# Patient Record
Sex: Female | Born: 1995
Health system: Southern US, Community
[De-identification: ages and names within clinical notes are randomized; demographics above are authoritative.]

## PROBLEM LIST (undated history)

## (undated) DIAGNOSIS — F32A Depression, unspecified: Secondary | ICD-10-CM

## (undated) DIAGNOSIS — F329 Major depressive disorder, single episode, unspecified: Secondary | ICD-10-CM

## (undated) DIAGNOSIS — F419 Anxiety disorder, unspecified: Secondary | ICD-10-CM

## (undated) DIAGNOSIS — G43909 Migraine, unspecified, not intractable, without status migrainosus: Secondary | ICD-10-CM

## (undated) HISTORY — DX: Anxiety disorder, unspecified: F41.9

## (undated) HISTORY — DX: Major depressive disorder, single episode, unspecified: F32.9

## (undated) HISTORY — DX: Migraine, unspecified, not intractable, without status migrainosus: G43.909

## (undated) HISTORY — PX: NO PAST SURGERIES: SHX2092

## (undated) HISTORY — DX: Depression, unspecified: F32.A

---

## 2018-01-27 DIAGNOSIS — F410 Panic disorder [episodic paroxysmal anxiety] without agoraphobia: Secondary | ICD-10-CM | POA: Diagnosis not present

## 2018-01-27 DIAGNOSIS — F332 Major depressive disorder, recurrent severe without psychotic features: Secondary | ICD-10-CM | POA: Diagnosis not present

## 2018-01-27 DIAGNOSIS — F411 Generalized anxiety disorder: Secondary | ICD-10-CM | POA: Diagnosis not present

## 2018-02-24 DIAGNOSIS — F332 Major depressive disorder, recurrent severe without psychotic features: Secondary | ICD-10-CM | POA: Diagnosis not present

## 2018-02-24 DIAGNOSIS — F411 Generalized anxiety disorder: Secondary | ICD-10-CM | POA: Diagnosis not present

## 2018-02-24 DIAGNOSIS — F410 Panic disorder [episodic paroxysmal anxiety] without agoraphobia: Secondary | ICD-10-CM | POA: Diagnosis not present

## 2018-03-24 DIAGNOSIS — F332 Major depressive disorder, recurrent severe without psychotic features: Secondary | ICD-10-CM | POA: Diagnosis not present

## 2018-03-24 DIAGNOSIS — F410 Panic disorder [episodic paroxysmal anxiety] without agoraphobia: Secondary | ICD-10-CM | POA: Diagnosis not present

## 2018-03-24 DIAGNOSIS — F411 Generalized anxiety disorder: Secondary | ICD-10-CM | POA: Diagnosis not present

## 2018-04-12 DIAGNOSIS — N390 Urinary tract infection, site not specified: Secondary | ICD-10-CM | POA: Diagnosis not present

## 2018-04-21 DIAGNOSIS — Z113 Encounter for screening for infections with a predominantly sexual mode of transmission: Secondary | ICD-10-CM | POA: Diagnosis not present

## 2018-04-21 DIAGNOSIS — R300 Dysuria: Secondary | ICD-10-CM | POA: Diagnosis not present

## 2018-04-21 DIAGNOSIS — K6289 Other specified diseases of anus and rectum: Secondary | ICD-10-CM | POA: Diagnosis not present

## 2018-04-21 DIAGNOSIS — R102 Pelvic and perineal pain: Secondary | ICD-10-CM | POA: Diagnosis not present

## 2018-04-21 DIAGNOSIS — B373 Candidiasis of vulva and vagina: Secondary | ICD-10-CM | POA: Diagnosis not present

## 2018-05-12 DIAGNOSIS — R3982 Chronic bladder pain: Secondary | ICD-10-CM | POA: Diagnosis not present

## 2018-05-12 DIAGNOSIS — Z68.41 Body mass index (BMI) pediatric, 5th percentile to less than 85th percentile for age: Secondary | ICD-10-CM | POA: Diagnosis not present

## 2018-05-12 DIAGNOSIS — R102 Pelvic and perineal pain: Secondary | ICD-10-CM | POA: Diagnosis not present

## 2018-05-12 DIAGNOSIS — R895 Abnormal microbiological findings in specimens from other organs, systems and tissues: Secondary | ICD-10-CM | POA: Diagnosis not present

## 2018-06-15 DIAGNOSIS — F410 Panic disorder [episodic paroxysmal anxiety] without agoraphobia: Secondary | ICD-10-CM | POA: Diagnosis not present

## 2018-06-15 DIAGNOSIS — F332 Major depressive disorder, recurrent severe without psychotic features: Secondary | ICD-10-CM | POA: Diagnosis not present

## 2018-06-15 DIAGNOSIS — F411 Generalized anxiety disorder: Secondary | ICD-10-CM | POA: Diagnosis not present

## 2018-06-18 DIAGNOSIS — F4001 Agoraphobia with panic disorder: Secondary | ICD-10-CM | POA: Diagnosis not present

## 2018-06-18 DIAGNOSIS — R45851 Suicidal ideations: Secondary | ICD-10-CM | POA: Diagnosis not present

## 2018-06-18 DIAGNOSIS — Z79899 Other long term (current) drug therapy: Secondary | ICD-10-CM | POA: Diagnosis not present

## 2018-06-18 DIAGNOSIS — X788XXA Intentional self-harm by other sharp object, initial encounter: Secondary | ICD-10-CM | POA: Diagnosis not present

## 2018-06-18 DIAGNOSIS — F331 Major depressive disorder, recurrent, moderate: Secondary | ICD-10-CM | POA: Diagnosis not present

## 2018-06-18 DIAGNOSIS — Z23 Encounter for immunization: Secondary | ICD-10-CM | POA: Diagnosis not present

## 2018-06-18 DIAGNOSIS — T1491XA Suicide attempt, initial encounter: Secondary | ICD-10-CM | POA: Diagnosis not present

## 2018-06-18 DIAGNOSIS — S61512A Laceration without foreign body of left wrist, initial encounter: Secondary | ICD-10-CM | POA: Diagnosis not present

## 2018-06-19 DIAGNOSIS — F4001 Agoraphobia with panic disorder: Secondary | ICD-10-CM | POA: Insufficient documentation

## 2018-06-19 DIAGNOSIS — T1491XA Suicide attempt, initial encounter: Secondary | ICD-10-CM | POA: Diagnosis not present

## 2018-06-19 DIAGNOSIS — F331 Major depressive disorder, recurrent, moderate: Secondary | ICD-10-CM | POA: Diagnosis not present

## 2018-07-07 DIAGNOSIS — J029 Acute pharyngitis, unspecified: Secondary | ICD-10-CM | POA: Diagnosis not present

## 2018-07-14 DIAGNOSIS — N341 Nonspecific urethritis: Secondary | ICD-10-CM | POA: Diagnosis not present

## 2018-07-14 DIAGNOSIS — Z6824 Body mass index (BMI) 24.0-24.9, adult: Secondary | ICD-10-CM | POA: Diagnosis not present

## 2018-09-14 DIAGNOSIS — F411 Generalized anxiety disorder: Secondary | ICD-10-CM | POA: Diagnosis not present

## 2018-09-14 DIAGNOSIS — F410 Panic disorder [episodic paroxysmal anxiety] without agoraphobia: Secondary | ICD-10-CM | POA: Diagnosis not present

## 2018-09-14 DIAGNOSIS — F332 Major depressive disorder, recurrent severe without psychotic features: Secondary | ICD-10-CM | POA: Diagnosis not present

## 2019-01-29 ENCOUNTER — Ambulatory Visit (INDEPENDENT_AMBULATORY_CARE_PROVIDER_SITE_OTHER): Payer: BLUE CROSS/BLUE SHIELD | Admitting: Family Medicine

## 2019-01-29 ENCOUNTER — Encounter: Payer: Self-pay | Admitting: Family Medicine

## 2019-01-29 ENCOUNTER — Other Ambulatory Visit: Payer: Self-pay

## 2019-01-29 VITALS — Ht 65.0 in | Wt 150.0 lb

## 2019-01-29 DIAGNOSIS — R198 Other specified symptoms and signs involving the digestive system and abdomen: Secondary | ICD-10-CM

## 2019-01-29 DIAGNOSIS — Z1159 Encounter for screening for other viral diseases: Secondary | ICD-10-CM | POA: Diagnosis not present

## 2019-01-29 DIAGNOSIS — Z114 Encounter for screening for human immunodeficiency virus [HIV]: Secondary | ICD-10-CM | POA: Diagnosis not present

## 2019-01-29 DIAGNOSIS — R1030 Lower abdominal pain, unspecified: Secondary | ICD-10-CM | POA: Insufficient documentation

## 2019-01-29 DIAGNOSIS — F332 Major depressive disorder, recurrent severe without psychotic features: Secondary | ICD-10-CM | POA: Insufficient documentation

## 2019-01-29 DIAGNOSIS — R197 Diarrhea, unspecified: Secondary | ICD-10-CM | POA: Insufficient documentation

## 2019-01-29 DIAGNOSIS — Z113 Encounter for screening for infections with a predominantly sexual mode of transmission: Secondary | ICD-10-CM

## 2019-01-29 DIAGNOSIS — F411 Generalized anxiety disorder: Secondary | ICD-10-CM | POA: Insufficient documentation

## 2019-01-29 MED ORDER — ESCITALOPRAM OXALATE 10 MG PO TABS: 10.0000 mg | ORAL_TABLET | Freq: Every day | ORAL | 0 refills | Status: DC

## 2019-01-29 MED ORDER — DICYCLOMINE HCL 10 MG PO CAPS: 10.0000 mg | ORAL_CAPSULE | Freq: Three times a day (TID) | ORAL | 0 refills | Status: DC | PRN

## 2019-01-29 NOTE — Progress Notes (Signed)
Name: Olivia Sullivan   MRN: [id number]    DOB: 11/29/1995   Date:01/29/2019       Progress Note  Subjective  Chief Complaint  Chief Complaint  Patient presents with  . Establish Care  . Abdominal Pain    on and off for 3 weeks  . Depression    refill medication, referral  . Anxiety    I connected with  Bethena Roys  on 01/29/19 at  8:40 AM EDT by a video enabled telemedicine application and verified that I am speaking with the correct person using two identifiers.  I discussed the limitations of evaluation and management by telemedicine and the availability of in person appointments. The patient expressed understanding and agreed to proceed. Staff also discussed with the patient that there may be a patient responsible charge related to this service. Patient Location: Home Provider Location: Home Additional Individuals present: None  HPI   Pt presents to establish care and for the following:  Social: Moved here 2 months ago with her boyfriend, family and friends live in [place].  She is working in [employer]   Abdominal Pain: She has family history of IBS (Sister and brother).  Has had constipation or diarrhea alternating for 3 weeks.  She has seen very dark diarrhea and has a "blood smell", nausea, bloating and flatulence.  Pain is occurring after she eats and is relieved after going to the bathroom, then returns a few hours later.  She has not changed her diet recently.  Gall bladder is intact. States pain is typically in the lower abdomen and central.  Also endorses nausea and occassional vomiting (2-3 times in a week - bile). Triggers are fried/oily foods and dairy.   Depression/Anxiety: She has had anxiety since she was a child, depression for about 5 years.  Self harms with razor on her upper thigh (last self-harm was 1 month ago), has never needed emergency care for injuries.  She has had multiple suicide attempts with overdose - most recently was 6-8 months ago and required  psychiatric hospitalization - her only psychiatric hospitalization.  She has since been taking  Lexapro, ran out about 2 months.  She has +PHQ9 for SI, however when discussed in detail, she denies any current suicidal ideation or thoughts of self harm.  She has her boyfriend and sister as support persons, discussed mobile crisis numbers as well.  There are no active problems to display for this patient.   Past Surgical History:  Procedure Laterality Date  . NO PAST SURGERIES      Family History  Problem Relation Age of Onset  . Diabetes Father   . Depression Father   . Anxiety disorder Father   . Depression Sister   . Anxiety disorder Sister   . Irritable bowel syndrome Sister   . Depression Brother   . Anxiety disorder Brother   . Irritable bowel syndrome Brother   . Diabetes Maternal Grandmother     Social History   Socioeconomic History  . Marital status: Single    Spouse name: Not on file  . Number of children: 0  . Years of education: Not on file  . Highest education level: Not on file  Occupational History  . Occupation: Psychologist, forensic  Social Needs  . Financial resource strain: Not hard at all  . Food insecurity:    Worry: Never true    Inability: Never true  . Transportation needs:    Medical: No    Non-medical: No  Tobacco Use  . Smoking status: Never Smoker  . Smokeless tobacco: Never Used  Substance and Sexual Activity  . Alcohol use: Never    Frequency: Never  . Drug use: Never  . Sexual activity: Yes    Partners: Male    Birth control/protection: Pill  Lifestyle  . Physical activity:    Days per week: 0 days    Minutes per session: 0 min  . Stress: Not at all  Relationships  . Social connections:    Talks on phone: More than three times a week    Gets together: More than three times a week    Attends religious service: Never    Active member of club or organization: No    Attends meetings of clubs or organizations: Never    Relationship  status: Never married  . Intimate partner violence:    Fear of current or ex partner: No    Emotionally abused: No    Physically abused: No    Forced sexual activity: No  Other Topics Concern  . Not on file  Social History Narrative   Lives with Boyfriend in [place]; family and friends live in [place].  Working as a Psychologist, forensic in [employer]     Current Outpatient Medications:  .  norgestimate-ethinyl estradiol (ORTHO-CYCLEN) 0.25-35 MG-MCG tablet, Take 1 tablet by mouth daily., Disp: , Rfl:  .  dicyclomine (BENTYL) 10 MG capsule, Take 1 capsule (10 mg total) by mouth 3 (three) times daily as needed for spasms., Disp: 30 capsule, Rfl: 0 .  escitalopram (LEXAPRO) 10 MG tablet, Take 1 tablet (10 mg total) by mouth daily., Disp: 90 tablet, Rfl: 0  Allergies not on file  I personally reviewed active problem list, medication list, allergies, family history, social history with the patient/caregiver today.   ROS Constitutional: Negative for fever or weight change.  Respiratory: Negative for cough and shortness of breath.   Cardiovascular: Negative for chest pain or palpitations.  Gastrointestinal: Positive for abdominal pain, and bowel changes.  Musculoskeletal: Negative for gait problem or joint swelling.  Skin: Negative for rash.  Neurological: Negative for dizziness or headache.  No other specific complaints in a complete review of systems (except as listed in HPI above).   Objective  Virtual encounter, vitals not obtained.  Body mass index is 24.96 kg/m.  Physical Exam  Constitutional: Patient appears well-developed and well-nourished. No distress.  HENT: Head: Normocephalic and atraumatic.  Neck: Normal range of motion. Pulmonary/Chest: Effort normal. No respiratory distress. Speaking in complete sentences Neurological: Pt is alert and oriented to person, place, and time. Coordination, speech and gait are normal.  Psychiatric: Patient has a normal mood and affect.  behavior is normal. Judgment and thought content normal. GI: Patient palpates her abdomen which appears soft, and she denies tenderness, though there is mild discomfort in the central lower abdomen.  No results found for this or any previous visit (from the past 72 hour(s)).  PHQ2/9: Depression screen [facility] 2/9 01/29/2019 01/29/2019  Decreased Interest 2 0  Down, Depressed, Hopeless 2 0  PHQ - 2 Score 4 0  Altered sleeping 3 0  Tired, decreased energy 2 0  Change in appetite 2 0  Feeling bad or failure about yourself  3 0  Trouble concentrating 2 0  Moving slowly or fidgety/restless 2 0  Suicidal thoughts 2 0  PHQ-9 Score 20 0  Difficult doing work/chores Very difficult Not difficult at all   PHQ-2/9 Result is positive.    Fall  Risk: Fall Risk  01/29/2019  Falls in the past year? 0  Number falls in past yr: 0  Injury with Fall? 0  Follow up Falls evaluation completed    Assessment & Plan  1. Lower abdominal pain - dicyclomine (BENTYL) 10 MG capsule; Take 1 capsule (10 mg total) by mouth 3 (three) times daily as needed for spasms.  Dispense: 30 capsule; Refill: 0 - Ambulatory referral to Gastroenterology - COMPLETE METABOLIC PANEL WITH GFR - CBC w/Diff/Platelet - Hepatitis C antibody  2. Alternating constipation and diarrhea - dicyclomine (BENTYL) 10 MG capsule; Take 1 capsule (10 mg total) by mouth 3 (three) times daily as needed for spasms.  Dispense: 30 capsule; Refill: 0 - Ambulatory referral to Gastroenterology  3. Need for hepatitis C screening test - Hepatitis C antibody  4. Encounter for screening for HIV - HIV Antibody (routine testing w rflx)  5. Routine screening for STI (sexually transmitted infection) - HIV Antibody (routine testing w rflx) - RPR - Will do pap w/ CPE and will obtain GC/chlam at that time.  6. GAD (generalized anxiety disorder) - escitalopram (LEXAPRO) 10 MG tablet; Take 1 tablet (10 mg total) by mouth daily.  Dispense: 90 tablet; Refill:  0 - Ambulatory referral to Psychiatry  7. Severe episode of recurrent major depressive disorder, without psychotic features (HCC) - escitalopram (LEXAPRO) 10 MG tablet; Take 1 tablet (10 mg total) by mouth daily.  Dispense: 90 tablet; Refill: 0 - Ambulatory referral to Psychiatry   I discussed the assessment and treatment plan with the patient. The patient was provided an opportunity to ask questions and all were answered. The patient agreed with the plan and demonstrated an understanding of the instructions.  The patient was advised to call back or seek an in-person evaluation if the symptoms worsen or if the condition fails to improve as anticipated.  I provided 36 minutes of non-face-to-face time during this encounter.

## 2019-01-29 NOTE — Patient Instructions (Addendum)
Here are some resources to help you if you feel you are in a mental health crisis:  National Suicide Prevention Lifeline - Call 575-314-92901-917-054-6425  for help - Website with more resources: ARanked.fihttps://suicidepreventionlifeline.org/  [facility] - Call [phone] for help. - Mobile Crisis Program available 24 hours a day, 365 days a year. - Available for anyone of any age in [place].  [facility] - Address: [address], [place] - Telephone: [phone]  - Hours of Operation: Sunday - Saturday - 8:00 a.m. - 8:00 p.m. - Medicaid, Medicare (Government Issued Only), BCBS, and [employer] - Pay - Crisis Management, Outpatient Individual & Group Therapy, Psychiatrists on-site to provide medication management, In-Home Psychiatric Care, and Peer Support Care.  [facility] - Call [phone] for help. - Mobile Crisis Program available 24 hours a day, 365 days a year. - Available for anyone of any age in [place] & [place]   Low-FODMAP Eating Plan  FODMAPs (fermentable oligosaccharides, disaccharides, monosaccharides, and polyols) are sugars that are hard for some people to digest. A low-FODMAP eating plan may help some people who have bowel (intestinal) diseases to manage their symptoms. This meal plan can be complicated to follow. Work with a diet and nutrition specialist (dietitian) to make a low-FODMAP eating plan that is right for you. A dietitian can make sure that you get enough nutrition from this diet. What are tips for following this plan? Reading food labels  Check labels for hidden FODMAPs such as: ? High-fructose syrup. ? Honey. ? Agave. ? Natural fruit flavors. ? Onion or garlic powder.  Choose low-FODMAP foods that contain 3-4 grams of fiber per serving.  Check food labels for serving sizes. Eat only one serving at a time to make sure FODMAP levels stay low. Meal  planning  Follow a low-FODMAP eating plan for up to 6 weeks, or as told by your health care provider or dietitian.  To follow the eating plan: 1. Eliminate high-FODMAP foods from your diet completely. 2. Gradually reintroduce high-FODMAP foods into your diet one at a time. Most people should wait a few days after introducing one high-FODMAP food before they introduce the next high-FODMAP food. Your dietitian can recommend how quickly you may reintroduce foods. 3. Keep a daily record of what you eat and drink, and make note of any symptoms that you have after eating. 4. Review your daily record with a dietitian regularly. Your dietitian can help you identify which foods you can eat and which foods you should avoid. General tips  Drink enough fluid each day to keep your urine pale yellow.  Avoid processed foods. These often have added sugar and may be high in FODMAPs.  Avoid most dairy products, whole grains, and sweeteners.  Work with a dietitian to make sure you get enough fiber in your diet. Recommended foods Grains  Gluten-free grains, such as rice, oats, buckwheat, quinoa, corn, polenta, and millet. Gluten-free pasta, bread, or cereal. Rice noodles. Corn tortillas. Vegetables  Eggplant, zucchini, cucumber, peppers, green beans, Brussels sprouts, bean sprouts, lettuce, arugula, kale, Swiss chard, spinach, collard greens, bok choy, summer squash, potato, and tomato. Limited amounts of corn, carrot, and sweet potato. Green parts of scallions. Fruits  Bananas, oranges, lemons, limes, blueberries, raspberries, strawberries, grapes, cantaloupe, honeydew melon, kiwi, papaya, passion fruit, and pineapple. Limited amounts of dried cranberries, banana chips, and shredded coconut. Dairy  Lactose-free milk, yogurt, and kefir. Lactose-free cottage cheese and ice cream. Non-dairy milks,  such as almond, coconut, hemp, and rice milk. Yogurts made of non-dairy milks. Limited amounts of goat cheese,  brie, mozzarella, parmesan, swiss, and other hard cheeses. Meats and other protein foods  Unseasoned beef, pork, poultry, or fish. Eggs. Tomasa Blase. Tofu (firm) and tempeh. Limited amounts of nuts and seeds, such as almonds, walnuts, [place] nuts, pecans, peanuts, pumpkin seeds, chia seeds, and sunflower seeds. Fats and oils  Butter-free spreads. Vegetable oils, such as olive, canola, and sunflower oil. Seasoning and other foods  Artificial sweeteners with names that do not end in "ol" such as aspartame, saccharine, and stevia. Maple syrup, white table sugar, raw sugar, brown sugar, and molasses. Fresh basil, coriander, parsley, rosemary, and thyme. Beverages  Water and mineral water. Sugar-sweetened soft drinks. Small amounts of orange juice or cranberry juice. Black and green tea. Most dry wines. Coffee. This may not be a complete list of low-FODMAP foods. Talk with your dietitian for more information. Foods to avoid Grains  Wheat, including kamut, durum, and semolina. Barley and bulgur. Couscous. Wheat-based cereals. Wheat noodles, bread, crackers, and pastries. Vegetables  Chicory root, artichoke, asparagus, cabbage, snow peas, sugar snap peas, mushrooms, and cauliflower. Onions, garlic, leeks, and the white part of scallions. Fruits  Fresh, dried, and juiced forms of apple, pear, watermelon, peach, plum, cherries, apricots, blackberries, boysenberries, figs, nectarines, and mango. Avocado. Dairy  Milk, yogurt, ice cream, and soft cheese. Cream and sour cream. Milk-based sauces. Custard. Meats and other protein foods  Fried or fatty meat. Sausage. Cashews and pistachios. Soybeans, baked beans, black beans, chickpeas, kidney beans, fava beans, navy beans, lentils, and split peas. Seasoning and other foods  Any sugar-free gum or candy. Foods that contain artificial sweeteners such as sorbitol, mannitol, isomalt, or xylitol. Foods that contain honey, high-fructose corn syrup, or agave.  Bouillon, vegetable stock, beef stock, and chicken stock. Garlic and onion powder. Condiments made with onion, such as hummus, chutney, pickles, relish, salad dressing, and salsa. Tomato paste. Beverages  Chicory-based drinks. Coffee substitutes. Chamomile tea. Fennel tea. Sweet or fortified wines such as port or sherry. Diet soft drinks made with isomalt, mannitol, maltitol, sorbitol, or xylitol. Apple, pear, and mango juice. Juices with high-fructose corn syrup. This may not be a complete list of high-FODMAP foods. Talk with your dietitian to discuss what dietary choices are best for you.  Summary  A low-FODMAP eating plan is a short-term diet that eliminates FODMAPs from your diet to help ease symptoms of certain bowel diseases.  The eating plan usually lasts up to 6 weeks. After that, high-FODMAP foods are restarted gradually, one at a time, so you can find out which may be causing symptoms.  A low-FODMAP eating plan can be complicated. It is best to work with a dietitian who has experience with this type of plan. This information is not intended to replace advice given to you by your health care provider. Make sure you discuss any questions you have with your health care provider. Document Released: 05/20/2017 Document Revised: 05/20/2017 Document Reviewed: 05/20/2017 Elsevier Interactive Patient Education  [employer].

## 2019-02-02 ENCOUNTER — Encounter: Payer: Self-pay | Admitting: Gastroenterology

## 2019-02-02 ENCOUNTER — Ambulatory Visit (INDEPENDENT_AMBULATORY_CARE_PROVIDER_SITE_OTHER): Payer: BLUE CROSS/BLUE SHIELD | Admitting: Gastroenterology

## 2019-02-02 DIAGNOSIS — Z1159 Encounter for screening for other viral diseases: Secondary | ICD-10-CM | POA: Diagnosis not present

## 2019-02-02 DIAGNOSIS — R197 Diarrhea, unspecified: Secondary | ICD-10-CM

## 2019-02-02 DIAGNOSIS — R1030 Lower abdominal pain, unspecified: Secondary | ICD-10-CM | POA: Diagnosis not present

## 2019-02-02 DIAGNOSIS — Z114 Encounter for screening for human immunodeficiency virus [HIV]: Secondary | ICD-10-CM | POA: Diagnosis not present

## 2019-02-02 DIAGNOSIS — Z113 Encounter for screening for infections with a predominantly sexual mode of transmission: Secondary | ICD-10-CM | POA: Diagnosis not present

## 2019-02-02 DIAGNOSIS — R112 Nausea with vomiting, unspecified: Secondary | ICD-10-CM

## 2019-02-02 NOTE — Progress Notes (Addendum)
Olivia Sullivan [address]  [address]  [place], [place] [address]  Main: [phone]  Fax: [phone]   Gastroenterology Consultation  Referring Provider:     Doren Custard, FNP Primary Care Physician:  Doren Custard, FNP Reason for Consultation:     Diarrhea        HPI:   Virtual Visit via Video Note  I connected with patient on 02/02/19 at 11:00 AM EDT by video ([email or web address]) and verified that I am speaking with the correct person using two identifiers.   I discussed the limitations, risks, security and privacy concerns of performing an evaluation and management service by video and the availability of in person appointments. I also discussed with the patient that there may be a patient responsible charge related to this service. The patient expressed understanding and agreed to proceed.  Location of the patient: Home Location of provider: Home Participating persons: Patient and provider only (Nursing staff checked in patient via phone but were not physically involved in the video interaction - see their notes)   History of Present Illness: Chief Complaint  Patient presents with  . New Patient (Initial Visit)    Lower abdominal pain    Olivia Sullivan is a 23 y.o. y/o female referred for consultation & management  by Dr. Annye Asa, Gerome Apley, FNP.  Patient reports 3-week history of bilateral lower abdominal cramping and diarrhea.  Reports the cramping to be a dull pain, nonradiating, 3/10.  Patient has also had some nausea and vomiting during this time.  No hematemesis, no hematochezia or melena.  Denies any sick contacts or recent travel.  States she is not pregnant as she uses birth control and has not been having sexual intercourse.  No weight loss.  No family history of colon cancer.  Reports brother has Crohn's disease.  No prior EGD or colonoscopy.  Denies any recent stressors or medication changes.  Bentyl was prescribed by primary care physician but patient has not  started this.  PCP notes report history of depression, and anxiety.  Past Medical History:  Diagnosis Date  . Anxiety   . Depression   . Migraine     Past Surgical History:  Procedure Laterality Date  . NO PAST SURGERIES      Prior to Admission medications   Medication Sig Start Date End Date Taking? Authorizing Provider  dicyclomine (BENTYL) 10 MG capsule Take 1 capsule (10 mg total) by mouth 3 (three) times daily as needed for spasms. 01/29/19  Yes Doren Custard, FNP  escitalopram (LEXAPRO) 10 MG tablet Take 1 tablet (10 mg total) by mouth daily. 01/29/19  Yes Doren Custard, FNP  norgestimate-ethinyl estradiol (ORTHO-CYCLEN) 0.25-35 MG-MCG tablet Take 1 tablet by mouth daily.   Yes [provider]    Family History  Problem Relation Age of Onset  . Diabetes Father   . Depression Father   . Anxiety disorder Father   . Depression Sister   . Anxiety disorder Sister   . Irritable bowel syndrome Sister   . Depression Brother   . Anxiety disorder Brother   . Irritable bowel syndrome Brother   . Diabetes Maternal Grandmother      Social History   Tobacco Use  . Smoking status: Never Smoker  . Smokeless tobacco: Never Used  Substance Use Topics  . Alcohol use: Never    Frequency: Never  . Drug use: Never    Allergies as of 02/02/2019  . (No Known Allergies)  Review of Systems:    All systems reviewed and negative except where noted in HPI.   Observations/Objective:  Labs: CBC No results found for: WBC, RBC, HGB, HCT, PLT, MCV, MCH, MCHC, RDW, LYMPHSABS, MONOABS, EOSABS, BASOSABS CMP  No results found for: NA, K, CL, CO2, GLUCOSE, BUN, CREATININE, CALCIUM, PROT, ALBUMIN, AST, ALT, ALKPHOS, BILITOT, GFRNONAA, GFRAA  Imaging Studies: No results found.  Assessment and Plan:   Olivia Sullivan is a 23 y.o. y/o female has been referred for abdominal pain, diarrhea  Assessment and Plan: Patient may be related to gastroenteritis given nausea vomiting  and diarrhea started at the same time  Patient had blood work drawn today and results are pending.  Will await pending labs.  We will also check inflammatory markers, fecal calprotectin, and GI panel to evaluate for inflammation or infectious causes. Will also check urine pregnancy test  Patient can start Bentyl to see if it helps with her symptoms.  If above work-up is abnormal, patient may need colonoscopy.  However, if it is normal, symptoms may be related to self-limited gastroenteritis.  Follow Up Instructions: Clinic follow-up in 3 to 4 weeks or earlier if needed  I discussed the assessment and treatment plan with the patient. The patient was provided an opportunity to ask questions and all were answered. The patient agreed with the plan and demonstrated an understanding of the instructions.   The patient was advised to call back or seek an in-person evaluation if the symptoms worsen or if the condition fails to improve as anticipated.  I provided 20 minutes of face-to-face time via video software during this encounter.   Pasty SpillersVarnita B Crispin Vogel, MD  Speech recognition software was used to dictate the above note.

## 2019-02-02 NOTE — Addendum Note (Signed)
Addended by: Jackquline Denmark on: 02/02/2019 11:51 AM   Modules accepted: Orders

## 2019-02-02 NOTE — Addendum Note (Signed)
Addended by: Melodie Bouillon on: 02/02/2019 11:21 AM   Modules accepted: Orders

## 2019-02-03 ENCOUNTER — Telehealth: Payer: Self-pay

## 2019-02-03 LAB — COMPLETE METABOLIC PANEL WITH GFR
AG Ratio: 1.5 (calc) (ref 1.0–2.5)
ALT: 14 U/L (ref 6–29)
AST: 17 U/L (ref 10–30)
Albumin: 4.1 g/dL (ref 3.6–5.1)
Alkaline phosphatase (APISO): 76 U/L (ref 31–125)
BUN: 14 mg/dL (ref 7–25)
CO2: 25 mmol/L (ref 20–32)
Calcium: 9.2 mg/dL (ref 8.6–10.2)
Chloride: 105 mmol/L (ref 98–110)
Creat: 0.73 mg/dL (ref 0.50–1.10)
GFR, Est African American: 136 mL/min/{1.73_m2} (ref >=60)
GFR, Est Non African American: 118 mL/min/{1.73_m2} (ref >=60)
Globulin: 2.8 g/dL (calc) (ref 1.9–3.7)
Glucose, Bld: 100 mg/dL — ABNORMAL HIGH (ref 65–99)
Potassium: 3.9 mmol/L (ref 3.5–5.3)
Sodium: 139 mmol/L (ref 135–146)
Total Bilirubin: 0.3 mg/dL (ref 0.2–1.2)
Total Protein: 6.9 g/dL (ref 6.1–8.1)

## 2019-02-03 LAB — CBC WITH DIFFERENTIAL/PLATELET
Absolute Monocytes: 601 cells/uL (ref 200–950)
Basophils Absolute: 64 cells/uL (ref 0–200)
Basophils Relative: 0.7 %
Eosinophils Absolute: 109 cells/uL (ref 15–500)
Eosinophils Relative: 1.2 %
HCT: 40.1 % (ref 35.0–45.0)
Hemoglobin: 13.1 g/dL (ref 11.7–15.5)
Lymphs Abs: 2958 cells/uL (ref 850–3900)
MCH: 27.6 pg (ref 27.0–33.0)
MCHC: 32.7 g/dL (ref 32.0–36.0)
MCV: 84.4 fL (ref 80.0–100.0)
MPV: 10.1 fL (ref 7.5–12.5)
Monocytes Relative: 6.6 %
Neutro Abs: 5369 cells/uL (ref 1500–7800)
Neutrophils Relative %: 59 %
Platelets: 422 10*3/uL — ABNORMAL HIGH (ref 140–400)
RBC: 4.75 10*6/uL (ref 3.80–5.10)
RDW: 12.3 % (ref 11.0–15.0)
Total Lymphocyte: 32.5 %
WBC: 9.1 10*3/uL (ref 3.8–10.8)

## 2019-02-03 LAB — HIV ANTIBODY (ROUTINE TESTING W REFLEX): HIV 1&2 Ab, 4th Generation: NONREACTIVE

## 2019-02-03 LAB — RPR: RPR Ser Ql: NONREACTIVE

## 2019-02-03 LAB — HEPATITIS C ANTIBODY
Hepatitis C Ab: NONREACTIVE
SIGNAL TO CUT-OFF: 0 (ref <1.00)

## 2019-02-03 NOTE — Telephone Encounter (Signed)
Pt notifed of labs needed and instructions for H pylori testing. Also told of the [facility] drawing station on [address].

## 2019-02-03 NOTE — Telephone Encounter (Signed)
-----   Message from [name], MD sent at 02/03/2019  9:39 AM EDT ----- Please ask pt to go ahead and get her labs done that I ordered. I have reviewed the CBC and CMP her PCP ordered.

## 2019-02-09 DIAGNOSIS — R197 Diarrhea, unspecified: Secondary | ICD-10-CM | POA: Diagnosis not present

## 2019-02-09 DIAGNOSIS — R112 Nausea with vomiting, unspecified: Secondary | ICD-10-CM | POA: Diagnosis not present

## 2019-02-10 LAB — H. PYLORI BREATH TEST: H pylori Breath Test: NEGATIVE

## 2019-02-10 LAB — SEDIMENTATION RATE: Sed Rate: 12 mm/hr (ref 0–32)

## 2019-02-10 LAB — C-REACTIVE PROTEIN: CRP: 6 mg/L (ref 0–10)

## 2019-02-10 LAB — PREGNANCY, URINE: Preg Test, Ur: NEGATIVE

## 2019-02-14 DIAGNOSIS — R197 Diarrhea, unspecified: Secondary | ICD-10-CM | POA: Diagnosis not present

## 2019-02-15 ENCOUNTER — Other Ambulatory Visit: Payer: Self-pay

## 2019-02-15 ENCOUNTER — Telehealth: Payer: Self-pay | Admitting: Gastroenterology

## 2019-02-15 DIAGNOSIS — R197 Diarrhea, unspecified: Secondary | ICD-10-CM

## 2019-02-15 NOTE — Telephone Encounter (Signed)
Orders for stool samples reordered and left pt message this was done and she go and get this done.

## 2019-02-15 NOTE — Telephone Encounter (Signed)
Pt is calling she states she recently went to [facility] to have Labs done she states we need to re order the stool sample

## 2019-02-16 ENCOUNTER — Encounter: Payer: Self-pay | Admitting: Family Medicine

## 2019-02-16 ENCOUNTER — Telehealth: Payer: Self-pay | Admitting: Family Medicine

## 2019-02-16 ENCOUNTER — Encounter: Payer: Self-pay | Admitting: Emergency Medicine

## 2019-02-16 ENCOUNTER — Ambulatory Visit (INDEPENDENT_AMBULATORY_CARE_PROVIDER_SITE_OTHER): Payer: BLUE CROSS/BLUE SHIELD | Admitting: Family Medicine

## 2019-02-16 ENCOUNTER — Other Ambulatory Visit: Payer: Self-pay

## 2019-02-16 DIAGNOSIS — R197 Diarrhea, unspecified: Secondary | ICD-10-CM | POA: Diagnosis not present

## 2019-02-16 DIAGNOSIS — F411 Generalized anxiety disorder: Secondary | ICD-10-CM

## 2019-02-16 DIAGNOSIS — F332 Major depressive disorder, recurrent severe without psychotic features: Secondary | ICD-10-CM | POA: Diagnosis not present

## 2019-02-16 DIAGNOSIS — R112 Nausea with vomiting, unspecified: Secondary | ICD-10-CM | POA: Diagnosis not present

## 2019-02-16 MED ORDER — ESCITALOPRAM OXALATE 10 MG PO TABS: 20.0000 mg | ORAL_TABLET | Freq: Every day | ORAL | 0 refills | Status: DC

## 2019-02-16 MED ORDER — ONDANSETRON HCL 4 MG PO TABS: 4.0000 mg | ORAL_TABLET | Freq: Three times a day (TID) | ORAL | 0 refills | Status: DC | PRN

## 2019-02-16 NOTE — Patient Instructions (Addendum)
Increase your Lexapro: Take 1.5 Tablets once daily for 7 days, then increase to 2 tablets once daily (20mg )  Here are some resources to help you if you feel you are in a mental health crisis:  National Suicide Prevention Lifeline - Call 574-749-0273  for help - Website with more resources: ARanked.fi  Transport planner Crisis Program - Call [phone] for help. - Mobile Crisis Program available 24 hours a day, 365 days a year. - Available for anyone of any age in [place].  [facility] - Address: [address], [place] - Telephone: [phone]  - Hours of Operation: Sunday - Saturday - 8:00 a.m. - 8:00 p.m. - Medicaid, Medicare (Government Issued Only), BCBS, and [employer] - Pay - Crisis Management, Outpatient Individual & Group Therapy, Psychiatrists on-site to provide medication management, In-Home Psychiatric Care, and Peer Support Care.  [facility] - Call [phone] for help. - Mobile Crisis Program available 24 hours a day, 365 days a year. - Available for anyone of any age in [place] [facility]

## 2019-02-16 NOTE — Telephone Encounter (Signed)
Patient notified, will call that office for appointment. Office note for work given

## 2019-02-16 NOTE — Progress Notes (Signed)
Name: Olivia Sullivan   MRN: [id number]    DOB: [birth year]   Date:02/16/2019       Progress Note  Subjective  Chief Complaint  Chief Complaint  Patient presents with  . Emesis    seen streaks of blood in vomit. patient did not want to go to ER  . Abdominal Pain  . Rectal Bleeding    streaks.     I connected with  Olivia Sullivan  on 02/16/19 at 10:00 AM EDT by a video enabled telemedicine application and verified that I am speaking with the correct person using two identifiers.  I discussed the limitations of evaluation and management by telemedicine and the availability of in person appointments. The patient expressed understanding and agreed to proceed. Staff also discussed with the patient that there may be a patient responsible charge related to this service. Patient Location: Home Provider Location: Home Additional Individuals present: None  HPI  Pt presents with the following concerns:  Dairrhea and Vomiting: Has noticed streaks of BRB in stool intermittently.  She has had 2-3 episodes of diarrhea in the last 24 hours.  This morning she vomiting at work and noticed some small dots of blood, no streaks, no coffee ground emesis.  She had 2 episodes of vomiting this morning.  She is seeing Dr. Maximino Greenlandahiliani for Diarrhea, Nausea, and Vomiting.  She did drop off stool sample yesterday; also had labs drawn on 02/09/2019.  She does not feel that her symptoms have worsened over the last 5-6 days since having her labs drawn.  She does endorse some mild lightheadedness, having fatigue.  She has not been able to check her BP at home.   Depression & Anxiety: She was started on Lexapro 10mg  on 01/29/2019 (about 3.5 weeks) and she does feel that it has helped some, though her PHQ-9 score is actually 4 points worse than last visit. We will increase Lexapro to 20mg  to help reduce symptoms.  She has an appointment with Dr. Maryruth BunKapur on 04/30/2019.  She denies SI/HI.    [facility]  PHQ-9 Total Score  24     Patient Active Problem List   Diagnosis Date Noted  . Lower abdominal pain 01/29/2019  . Alternating constipation and diarrhea 01/29/2019  . GAD (generalized anxiety disorder) 01/29/2019  . Severe episode of recurrent major depressive disorder, without psychotic features (HCC) 01/29/2019  . Panic disorder with agoraphobia 06/19/2018    Past Surgical History:  Procedure Laterality Date  . NO PAST SURGERIES      Family History  Problem Relation Age of Onset  . Diabetes Father   . Depression Father   . Anxiety disorder Father   . Depression Sister   . Anxiety disorder Sister   . Irritable bowel syndrome Sister   . Depression Brother   . Anxiety disorder Brother   . Irritable bowel syndrome Brother   . Diabetes Maternal Grandmother     Social History   Socioeconomic History  . Marital status: Single    Spouse name: Not on file  . Number of children: 0  . Years of education: Not on file  . Highest education level: Not on file  Occupational History  . Occupation: Psychologist, forensickennel tech  Social Needs  . Financial resource strain: Not hard at all  . Food insecurity:    Worry: Never true    Inability: Never true  . Transportation needs:    Medical: No    Non-medical:  No  Tobacco Use  . Smoking status: Never Smoker  . Smokeless tobacco: Never Used  Substance and Sexual Activity  . Alcohol use: Never    Frequency: Never  . Drug use: Never  . Sexual activity: Yes    Partners: Male    Birth control/protection: Pill  Lifestyle  . Physical activity:    Days per week: 0 days    Minutes per session: 0 min  . Stress: Not at all  Relationships  . Social connections:    Talks on phone: More than three times a week    Gets together: More than three times a week    Attends religious service: Never    Active member of club or organization: No    Attends meetings of clubs or organizations: Never    Relationship status: Never  married  . Intimate partner violence:    Fear of current or ex partner: No    Emotionally abused: No    Physically abused: No    Forced sexual activity: No  Other Topics Concern  . Not on file  Social History Narrative   Lives with Boyfriend in [place]; family and friends live in [place].  Working as a Psychologist, forensic in [employer]     Current Outpatient Medications:  .  dicyclomine (BENTYL) 10 MG capsule, Take 1 capsule (10 mg total) by mouth 3 (three) times daily as needed for spasms., Disp: 30 capsule, Rfl: 0 .  escitalopram (LEXAPRO) 10 MG tablet, Take 1 tablet (10 mg total) by mouth daily., Disp: 90 tablet, Rfl: 0 .  norgestimate-ethinyl estradiol (ORTHO-CYCLEN) 0.25-35 MG-MCG tablet, Take 1 tablet by mouth daily., Disp: , Rfl:   No Known Allergies  I personally reviewed active problem list, medication list, allergies, notes from last encounter, lab results with the patient/caregiver today.   ROS Ten systems reviewed and is negative except as mentioned in HPI  Objective  Virtual encounter, vitals not obtained.  There is no height or weight on file to calculate BMI.  Physical Exam Constitutional: Patient appears well-developed and well-nourished. No distress.  HENT: Head: Normocephalic and atraumatic.  Neck: Normal range of motion. Pulmonary/Chest: Effort normal. No respiratory distress. Speaking in complete sentences Neurological: Pt is alert and oriented to person, place, and time. Coordination, speech and gait are normal.  Psychiatric: Patient has a normal mood and affect. behavior is normal. Judgment and thought content normal. Abdominal: Endorses mild tenderness upon self-palpation of the abdomen in the central lower abdomen.    No results found for this or any previous visit (from the past 72 hour(s)).  PHQ2/9: Depression screen [facility] 2/9 02/16/2019 01/29/2019 01/29/2019  Decreased Interest 2 2 0  Down, Depressed, Hopeless 2 2 0  PHQ - 2 Score 4 4 0  Altered  sleeping 3 3 0  Tired, decreased energy 3 2 0  Change in appetite 3 2 0  Feeling bad or failure about yourself  3 3 0  Trouble concentrating 3 2 0  Moving slowly or fidgety/restless 3 2 0  Suicidal thoughts 2 2 0  PHQ-9 Score 24 20 0  Difficult doing work/chores Extremely dIfficult Very difficult Not difficult at all   PHQ-2/9 Result is positive.    Fall Risk: Fall Risk  02/16/2019 01/29/2019  Falls in the past year? 0 0  Number falls in past yr: 0 0  Injury with Fall? 0 0  Follow up Falls evaluation completed Falls evaluation completed    Assessment & Plan  1. Non-intractable vomiting  with nausea, unspecified vomiting type - Advised to follow up with Dr. Maximino Greenland; will forward note to her from today. - ondansetron (ZOFRAN) 4 MG tablet; Take 1 tablet (4 mg total) by mouth every 8 (eight) hours as needed for nausea or vomiting.  Dispense: 20 tablet; Refill: 0  2. Diarrhea, unspecified type - Stool pathogen panel is pending with GI. - Advised to follow up with Dr. Maximino Greenland; will forward note to her from today.  3. Severe episode of recurrent major depressive disorder, without psychotic features (HCC) - Increase Lexapro to  daily; needs to see psychiatry - has new appt in July 2020 - escitalopram (LEXAPRO) 10 MG tablet; Take 2 tablets (20 mg total) by mouth daily.  Dispense: 90 tablet; Refill: 0  4. GAD (generalized anxiety disorder) - escitalopram (LEXAPRO) 10 MG tablet; Take 2 tablets (20 mg total) by mouth daily.  Dispense: 90 tablet; Refill: 0  I discussed the assessment and treatment plan with the patient. The patient was provided an opportunity to ask questions and all were answered. The patient agreed with the plan and demonstrated an understanding of the instructions.  The patient was advised to call back or seek an in-person evaluation if the symptoms worsen or if the condition fails to improve as anticipated.  I provided 16 minutes of non-face-to-face time during  this encounter.

## 2019-02-16 NOTE — Telephone Encounter (Signed)
Received message back from GI:  Please let pt know her stool test results are still pending. However, due to her symptoms we can go ahead and schedule her for EGD and colonoscopy in [place] for abdominal pain, diarrhea and blood in stool.  Please have patient call Dr. Michele Mcalpine office to schedule per Dr. Michele Mcalpine request.

## 2019-02-17 ENCOUNTER — Telehealth: Payer: Self-pay | Admitting: Gastroenterology

## 2019-02-17 NOTE — Telephone Encounter (Signed)
-----   Message from [name], MD sent at 02/16/2019 12:07 PM EDT ----- Please let pt know her stool test results are still pending. However, due to her symptoms we can go ahead and schedule her for EGD and colonoscopy in [place] for abdominal pain, diarrhea and blood in stool. Thank you ----- Message ----- From: Doren Custard, FNP Sent: 02/16/2019  10:13 AM EDT To: [name], MD  Patient called in today - having small amnt of blood in emesis and stool.  She declined CBC; did provide stool sample to your office yesterday 02/15/2019.

## 2019-02-17 NOTE — Telephone Encounter (Signed)
LVM for pt to call office back to schedule her EGD and Colonoscopy with Dr. Maximino Greenland at [facility].  Note to Self: Date to schedule is May 26th ok'd with Dr. Maximino Greenland.  Thanks [employer]

## 2019-02-17 NOTE — Telephone Encounter (Signed)
Patient called in stating she spoke with her PCP & she spoke with Dr T they would like for her to be scheduled for a colonoscopy because the patient is vomiting &  Has diarrhea. Her next appointed is scheduled with Dr T on 03-17-19.

## 2019-02-18 ENCOUNTER — Other Ambulatory Visit: Payer: Self-pay

## 2019-02-18 ENCOUNTER — Telehealth: Payer: Self-pay | Admitting: Gastroenterology

## 2019-02-18 DIAGNOSIS — R1084 Generalized abdominal pain: Secondary | ICD-10-CM

## 2019-02-18 DIAGNOSIS — K921 Melena: Secondary | ICD-10-CM

## 2019-02-18 DIAGNOSIS — Z01812 Encounter for preprocedural laboratory examination: Secondary | ICD-10-CM

## 2019-02-18 DIAGNOSIS — R197 Diarrhea, unspecified: Secondary | ICD-10-CM

## 2019-02-18 LAB — GI PROFILE, STOOL, PCR

## 2019-02-18 LAB — CALPROTECTIN, FECAL: Calprotectin, Fecal: <16 ug/g (ref 0–120)

## 2019-02-18 MED ORDER — NA SULFATE-K SULFATE-MG SULF 17.5-3.13-1.6 GM/177ML PO SOLN: 1.0000 | Freq: Once | ORAL | 0 refills | Status: AC

## 2019-02-18 NOTE — Telephone Encounter (Signed)
Pt is calling she needs to reschedule her Procedure 03/02/19 due to not having someone to accompany her

## 2019-02-18 NOTE — Telephone Encounter (Signed)
Patient was scheduled for 03/02/19 for her procedures, and realized that she can not do this date due to not having anyone to accompany her. She also said she had the COVID19 test performed a month and half ago at [facility] and it was Negative.  She wants to know if she still has to have it.  The next available date to schedule her for her procedures would be on Thursday May 28th at [facility].  I left her a voice message to see if this date will work for her.  Currently awaiting call back.  Thanks [employer]

## 2019-02-18 NOTE — Telephone Encounter (Signed)
Colonoscopy w/ EGD has been scheduled for June 4th at [facility] with Dr. Maximino Greenland.  COVID19 Test ordered for June 1st.  Thanks Marcelino Duster

## 2019-02-18 NOTE — Telephone Encounter (Signed)
Patient called in returning Michelle's call to schedule a colonoscopy

## 2019-02-18 NOTE — Telephone Encounter (Signed)
Pt is calling to schedule a colonoscopy she received a call to schedule this

## 2019-02-23 ENCOUNTER — Telehealth: Payer: Self-pay | Admitting: Gastroenterology

## 2019-02-23 NOTE — Telephone Encounter (Signed)
Pt is calling she spoke with Marcelino Duster to have covid19 testing she was told it would be 03/08/19 and Mychart states 03/05/19 please call pt to clarify

## 2019-02-23 NOTE — Telephone Encounter (Signed)
Returned patients phone call regarding test date for COVID 19.  Informed her that I ordered the test for June 1st as I informed her, however its possible Pre-Admit Testing placed another order.  Gave her the number Pre-Admit Testing to ask them to clarify the date she needs to have the testing.  Thanks,  Iva Lento CMA

## 2019-03-02 ENCOUNTER — Ambulatory Visit: Admit: 2019-03-02 | Payer: BLUE CROSS/BLUE SHIELD | Admitting: Gastroenterology

## 2019-03-02 SURGERY — COLONOSCOPY WITH PROPOFOL
Anesthesia: Choice

## 2019-03-05 ENCOUNTER — Other Ambulatory Visit
Admission: RE | Admit: 2019-03-05 | Discharge: 2019-03-05 | Disposition: A | Discharge status: Home / Self Care | Payer: BC Managed Care – PPO | Source: Ambulatory Visit | Attending: Gastroenterology | Admitting: Gastroenterology

## 2019-03-05 ENCOUNTER — Other Ambulatory Visit: Payer: Self-pay

## 2019-03-05 DIAGNOSIS — Z1159 Encounter for screening for other viral diseases: Secondary | ICD-10-CM | POA: Diagnosis not present

## 2019-03-06 LAB — NOVEL CORONAVIRUS, NAA (HOSP ORDER, SEND-OUT TO REF LAB; TAT 18-24 HRS): SARS-CoV-2, NAA: NOT DETECTED

## 2019-03-10 ENCOUNTER — Encounter: Payer: Self-pay | Admitting: Anesthesiology

## 2019-03-11 ENCOUNTER — Ambulatory Visit: Payer: BC Managed Care – PPO | Admitting: Anesthesiology

## 2019-03-11 ENCOUNTER — Other Ambulatory Visit: Payer: Self-pay

## 2019-03-11 ENCOUNTER — Encounter
Admission: RE | Disposition: A | Discharge status: Home / Self Care | Payer: Self-pay | Source: Home / Self Care | Attending: Gastroenterology

## 2019-03-11 ENCOUNTER — Ambulatory Visit
Admission: RE | Admit: 2019-03-11 | Discharge: 2019-03-11 | Disposition: A | Discharge status: Home / Self Care | Payer: BC Managed Care – PPO | Attending: Gastroenterology | Admitting: Gastroenterology

## 2019-03-11 DIAGNOSIS — G43909 Migraine, unspecified, not intractable, without status migrainosus: Secondary | ICD-10-CM | POA: Insufficient documentation

## 2019-03-11 DIAGNOSIS — R197 Diarrhea, unspecified: Secondary | ICD-10-CM | POA: Insufficient documentation

## 2019-03-11 DIAGNOSIS — Z833 Family history of diabetes mellitus: Secondary | ICD-10-CM | POA: Insufficient documentation

## 2019-03-11 DIAGNOSIS — F329 Major depressive disorder, single episode, unspecified: Secondary | ICD-10-CM | POA: Diagnosis not present

## 2019-03-11 DIAGNOSIS — K648 Other hemorrhoids: Secondary | ICD-10-CM | POA: Diagnosis not present

## 2019-03-11 DIAGNOSIS — E755 Other lipid storage disorders: Secondary | ICD-10-CM | POA: Diagnosis not present

## 2019-03-11 DIAGNOSIS — F419 Anxiety disorder, unspecified: Secondary | ICD-10-CM | POA: Diagnosis not present

## 2019-03-11 DIAGNOSIS — R112 Nausea with vomiting, unspecified: Secondary | ICD-10-CM | POA: Insufficient documentation

## 2019-03-11 DIAGNOSIS — Z818 Family history of other mental and behavioral disorders: Secondary | ICD-10-CM | POA: Insufficient documentation

## 2019-03-11 DIAGNOSIS — Z79899 Other long term (current) drug therapy: Secondary | ICD-10-CM | POA: Diagnosis not present

## 2019-03-11 DIAGNOSIS — K6289 Other specified diseases of anus and rectum: Secondary | ICD-10-CM

## 2019-03-11 DIAGNOSIS — K921 Melena: Secondary | ICD-10-CM

## 2019-03-11 DIAGNOSIS — Z8379 Family history of other diseases of the digestive system: Secondary | ICD-10-CM | POA: Diagnosis not present

## 2019-03-11 DIAGNOSIS — K5710 Diverticulosis of small intestine without perforation or abscess without bleeding: Secondary | ICD-10-CM | POA: Diagnosis not present

## 2019-03-11 DIAGNOSIS — R109 Unspecified abdominal pain: Secondary | ICD-10-CM | POA: Diagnosis not present

## 2019-03-11 DIAGNOSIS — R1084 Generalized abdominal pain: Secondary | ICD-10-CM

## 2019-03-11 HISTORY — PX: ESOPHAGOGASTRODUODENOSCOPY (EGD) WITH PROPOFOL: SHX5813

## 2019-03-11 HISTORY — PX: COLONOSCOPY WITH PROPOFOL: SHX5780

## 2019-03-11 LAB — POCT PREGNANCY, URINE: Preg Test, Ur: NEGATIVE

## 2019-03-11 SURGERY — COLONOSCOPY WITH PROPOFOL
Anesthesia: General

## 2019-03-11 MED ORDER — LIDOCAINE HCL (CARDIAC) PF 100 MG/5ML IV SOSY
PREFILLED_SYRINGE | INTRAVENOUS | Status: DC | PRN
  Administered 2019-03-11: 30 mg via INTRAVENOUS

## 2019-03-11 MED ORDER — FENTANYL CITRATE (PF) 100 MCG/2ML IJ SOLN
INTRAMUSCULAR | Status: AC
  Filled 2019-03-11: qty 2

## 2019-03-11 MED ORDER — DEXAMETHASONE SODIUM PHOSPHATE 4 MG/ML IJ SOLN
INTRAMUSCULAR | Status: AC
  Filled 2019-03-11: qty 1

## 2019-03-11 MED ORDER — PROPOFOL 500 MG/50ML IV EMUL
INTRAVENOUS | Status: DC | PRN
  Administered 2019-03-11: 120 ug/kg/min via INTRAVENOUS

## 2019-03-11 MED ORDER — ONDANSETRON HCL 4 MG/2ML IJ SOLN
INTRAMUSCULAR | Status: AC
  Filled 2019-03-11: qty 2

## 2019-03-11 MED ORDER — MIDAZOLAM HCL 2 MG/2ML IJ SOLN
INTRAMUSCULAR | Status: DC | PRN
  Administered 2019-03-11: 2 mg via INTRAVENOUS

## 2019-03-11 MED ORDER — FENTANYL CITRATE (PF) 100 MCG/2ML IJ SOLN
INTRAMUSCULAR | Status: DC | PRN
  Administered 2019-03-11 (×2): 50 ug via INTRAVENOUS

## 2019-03-11 MED ORDER — BUTAMBEN-TETRACAINE-BENZOCAINE 2-2-14 % EX AERO
INHALATION_SPRAY | CUTANEOUS | Status: AC
  Filled 2019-03-11: qty 5

## 2019-03-11 MED ORDER — MIDAZOLAM HCL 2 MG/2ML IJ SOLN
INTRAMUSCULAR | Status: AC
  Filled 2019-03-11: qty 2

## 2019-03-11 MED ORDER — DEXAMETHASONE SODIUM PHOSPHATE 10 MG/ML IJ SOLN
INTRAMUSCULAR | Status: DC | PRN
  Administered 2019-03-11: 4 mg via INTRAVENOUS

## 2019-03-11 MED ORDER — PROPOFOL 500 MG/50ML IV EMUL
INTRAVENOUS | Status: AC
  Filled 2019-03-11: qty 100

## 2019-03-11 MED ORDER — SODIUM CHLORIDE 0.9 % IV SOLN
INTRAVENOUS | Status: DC
  Administered 2019-03-11: 1000 mL via INTRAVENOUS

## 2019-03-11 MED ORDER — LIDOCAINE HCL (PF) 2 % IJ SOLN
INTRAMUSCULAR | Status: AC
  Filled 2019-03-11: qty 10

## 2019-03-11 MED ORDER — ONDANSETRON HCL 4 MG/2ML IJ SOLN
INTRAMUSCULAR | Status: DC | PRN
  Administered 2019-03-11: 4 mg via INTRAVENOUS

## 2019-03-11 NOTE — H&P (Signed)
Melodie Bouillon, MD [address], [address], [place], [place], [address] [address], [place], [place], [address] Phone: [phone]  Fax: [phone]  Primary Care Physician:  Doren Custard, FNP   Pre-Procedure History & Physical: HPI:  Jerniya Smutny is a 23 y.o. female is here for a colonoscopy and EGD.   Past Medical History:  Diagnosis Date  . Anxiety   . Depression   . Migraine     Past Surgical History:  Procedure Laterality Date  . NO PAST SURGERIES      Prior to Admission medications   Medication Sig Start Date End Date Taking? Authorizing Provider  dicyclomine (BENTYL) 10 MG capsule Take 1 capsule (10 mg total) by mouth 3 (three) times daily as needed for spasms. 01/29/19  Yes Doren Custard, FNP  escitalopram (LEXAPRO) 10 MG tablet Take 2 tablets (20 mg total) by mouth daily. 02/16/19  Yes Doren Custard, FNP  norgestimate-ethinyl estradiol (ORTHO-CYCLEN) 0.25-35 MG-MCG tablet Take 1 tablet by mouth daily.   Yes [provider]  ondansetron (ZOFRAN) 4 MG tablet Take 1 tablet (4 mg total) by mouth every 8 (eight) hours as needed for nausea or vomiting. 02/16/19  Yes Doren Custard, FNP    Allergies as of 02/18/2019  . (No Known Allergies)    Family History  Problem Relation Age of Onset  . Diabetes Father   . Depression Father   . Anxiety disorder Father   . Depression Sister   . Anxiety disorder Sister   . Irritable bowel syndrome Sister   . Depression Brother   . Anxiety disorder Brother   . Irritable bowel syndrome Brother   . Diabetes Maternal Grandmother     Social History   Socioeconomic History  . Marital status: Single    Spouse name: Not on file  . Number of children: 0  . Years of education: Not on file  . Highest education level: Not on file  Occupational History  . Occupation: Psychologist, forensic  Social Needs  . Financial resource strain: Not hard at all  . Food insecurity:    Worry: Never true    Inability: Never  true  . Transportation needs:    Medical: No    Non-medical: No  Tobacco Use  . Smoking status: Never Smoker  . Smokeless tobacco: Never Used  Substance and Sexual Activity  . Alcohol use: Never    Frequency: Never  . Drug use: Never  . Sexual activity: Yes    Partners: Male    Birth control/protection: Pill  Lifestyle  . Physical activity:    Days per week: 0 days    Minutes per session: 0 min  . Stress: Not at all  Relationships  . Social connections:    Talks on phone: More than three times a week    Gets together: More than three times a week    Attends religious service: Never    Active member of club or organization: No    Attends meetings of clubs or organizations: Never    Relationship status: Never married  . Intimate partner violence:    Fear of current or ex partner: No    Emotionally abused: No    Physically abused: No    Forced sexual activity: No  Other Topics Concern  . Not on file  Social History Narrative   Lives with Boyfriend in [place]; family and friends live in [place].  Working as a Psychologist, forensic in [employer]  Review of Systems: See HPI, otherwise negative ROS  Physical Exam: BP 122/85   Pulse (!) 116   Temp 98.7 F (37.1 C) (Tympanic)   Resp 16   Ht 5\' 5"  (1.651 m)   Wt 67.1 kg   SpO2 100%   BMI 24.63 kg/m  General:   Alert,  pleasant and cooperative in NAD Head:  Normocephalic and atraumatic. Neck:  Supple; no masses or thyromegaly. Lungs:  Clear throughout to auscultation, normal respiratory effort.    Heart:  +S1, +S2, Regular rate and rhythm, No edema. Abdomen:  Soft, nontender and nondistended. Normal bowel sounds, without guarding, and without rebound.   Neurologic:  Alert and  oriented x4;  grossly normal neurologically.  Impression/Plan: Bethena RoysKaitlyn Waring is here for a colonoscopy and EGD for N/V and diarrhea and abdominal pain  Risks, benefits, limitations, and alternatives regarding the procedures have been reviewed with  the patient.  Questions have been answered.  All parties agreeable.   Pasty SpillersVarnita B Livie Vanderhoof, MD  03/11/2019, 8:31 AM

## 2019-03-11 NOTE — Anesthesia Preprocedure Evaluation (Signed)
Anesthesia Evaluation  Patient identified by MRN, date of birth, ID band Patient awake    Reviewed: Allergy & Precautions, NPO status , Patient's Chart, lab work & pertinent test results, reviewed documented beta blocker date and time   Airway Mallampati: II  TM Distance: >3 FB     Dental  (+) Chipped   Pulmonary           Cardiovascular      Neuro/Psych  Headaches, PSYCHIATRIC DISORDERS Anxiety Depression    GI/Hepatic   Endo/Other    Renal/GU      Musculoskeletal   Abdominal   Peds  Hematology   Anesthesia Other Findings   Reproductive/Obstetrics                             Anesthesia Physical Anesthesia Plan  ASA: II  Anesthesia Plan: General   Post-op Pain Management:    Induction: Intravenous  PONV Risk Score and Plan:   Airway Management Planned:   Additional Equipment:   Intra-op Plan:   Post-operative Plan:   Informed Consent: I have reviewed the patients History and Physical, chart, labs and discussed the procedure including the risks, benefits and alternatives for the proposed anesthesia with the patient or authorized representative who has indicated his/her understanding and acceptance.       Plan Discussed with: CRNA  Anesthesia Plan Comments:         Anesthesia Quick Evaluation

## 2019-03-11 NOTE — Anesthesia Post-op Follow-up Note (Signed)
Anesthesia QCDR form completed.        

## 2019-03-11 NOTE — Op Note (Signed)
[facility] Gastroenterology Patient Name: Olivia RoysKaitlyn Dhanani Procedure Date: 03/11/2019 8:32 AM MRN: [id number] Account #: [id number] Date of Birth: [birth year] Admit Type: Outpatient Age: 23 Room: [facility] ENDO ROOM 4 Gender: Female Note Status: Finalized Procedure:            Upper GI endoscopy Indications:          Abdominal pain, Nausea with vomiting Providers:            Varnita B. Maximino Greenlandahiliani MD, MD Referring MD:         Krista BlueMatthew Boyce, MD (Referring MD) Medicines:            Monitored Anesthesia Care Complications:        No immediate complications. Procedure:            Pre-Anesthesia Assessment:                       - Prior to the procedure, a History and Physical was                        performed, and patient medications, allergies and                        sensitivities were reviewed. The patient's tolerance of                        previous anesthesia was reviewed.                       - The risks and benefits of the procedure and the                        sedation options and risks were discussed with the                        patient. All questions were answered and informed                        consent was obtained.                       - Patient identification and proposed procedure were                        verified prior to the procedure by the physician, the                        nurse, the anesthesiologist, the anesthetist and the                        technician. The procedure was verified in the procedure                        room.                       - ASA Grade Assessment: II - A patient with mild                        systemic disease.  After obtaining informed consent, the endoscope was                        passed under direct vision. Throughout the procedure,                        the patient's blood pressure, pulse, and oxygen                        saturations were monitored continuously. The Endoscope                         was introduced through the mouth, and advanced to the                        third part of duodenum. The upper GI endoscopy was                        accomplished with ease. The patient tolerated the                        procedure well. Findings:      The examined esophagus was normal.      The entire examined stomach was normal. Biopsies were obtained in the       gastric body, at the incisura and in the gastric antrum with cold       forceps for histology. Biopsies were taken with a cold forceps for       Helicobacter pylori testing.      A 4 mm non-bleeding diverticulum was found in the second portion of the       duodenum. The ampulla was seen adjacent to the diverticulum. Biopsies       for celiac disease were taken well away and not close to the ampulla or       diverticulum.      The duodenal bulb, second portion of the duodenum and examined duodenum       were normal. Biopsies for histology were taken with a cold forceps for       evaluation of celiac disease. Impression:           - Normal esophagus.                       - Normal stomach. Biopsied.                       - Non-bleeding duodenal diverticulum.                       - Normal duodenal bulb, second portion of the duodenum                        and examined duodenum. Biopsied.                       - Biopsies were obtained in the gastric body, at the                        incisura and in the gastric antrum. Recommendation:       - Await pathology results.                       -  Discharge patient to home (with escort).                       - Advance diet as tolerated.                       - Continue present medications.                       - Patient has a contact number available for                        emergencies. The signs and symptoms of potential                        delayed complications were discussed with the patient.                        Return to normal activities  tomorrow. Written discharge                        instructions were provided to the patient.                       - Discharge patient to home (with escort).                       - The findings and recommendations were discussed with                        the patient.                       - The findings and recommendations were discussed with                        the patient's family. Procedure Code(s):    --- Professional ---                       [id number], Esophagogastroduodenoscopy, flexible, transoral;                        with biopsy, single or multiple Diagnosis Code(s):    --- Professional ---                       R10.9, Unspecified abdominal pain                       R11.2, Nausea with vomiting, unspecified                       K57.10, Diverticulosis of small intestine without                        perforation or abscess without bleeding CPT copyright 2019 American Medical Association. All rights reserved. The codes documented in this report are preliminary and upon coder review may  be revised to meet current compliance requirements.  Melodie Bouillon, MD Michel Bickers B. Maximino Greenland MD, MD 03/11/2019 8:49:14 AM This report has been signed electronically. Number of Addenda: 0 Note Initiated On: 03/11/2019 8:32 AM Estimated Blood Loss: Estimated blood loss: none.      [facility]

## 2019-03-11 NOTE — Transfer of Care (Signed)
Immediate Anesthesia Transfer of Care Note  Patient: Olivia Sullivan  Procedure(s) Performed: COLONOSCOPY WITH PROPOFOL (N/A ) ESOPHAGOGASTRODUODENOSCOPY (EGD) WITH PROPOFOL (N/A )  Patient Location: PACU  Anesthesia Type:General  Level of Consciousness: awake and sedated  Airway & Oxygen Therapy: Patient Spontanous Breathing and Patient connected to nasal cannula oxygen  Post-op Assessment: Report given to RN and Post -op Vital signs reviewed and stable  Post vital signs: Reviewed and stable  Last Vitals:  Vitals Value Taken Time  BP    Temp    Pulse    Resp    SpO2      Last Pain:  Vitals:   03/11/19 1005  TempSrc:   PainSc: 0-No pain         Complications: No apparent anesthesia complications

## 2019-03-11 NOTE — Op Note (Signed)
[facility] Gastroenterology Patient Name: Olivia RoysKaitlyn Hedman Procedure Date: 03/11/2019 8:31 AM MRN: [id number] Account #: [id number] Date of Birth: 06-27-1996 Admit Type: Outpatient Age: 23 [facility] 4 Gender: Female Note Status: Finalized Procedure:            Colonoscopy Indications:          Abdominal pain, Diarrhea Providers:            Donnalee Cellucci B. Maximino Greenlandahiliani MD, MD Referring MD:         Krista BlueMatthew Boyce, MD (Referring MD) Medicines:            Monitored Anesthesia Care Complications:        No immediate complications. Procedure:            Pre-Anesthesia Assessment:                       - Prior to the procedure, a History and Physical was                        performed, and patient medications, allergies and                        sensitivities were reviewed. The patient's tolerance of                        previous anesthesia was reviewed.                       - The risks and benefits of the procedure and the                        sedation options and risks were discussed with the                        patient. All questions were answered and informed                        consent was obtained.                       - Patient identification and proposed procedure were                        verified prior to the procedure by the physician, the                        nurse, the anesthetist and the technician. The                        procedure was verified in the pre-procedure area in the                        procedure room in the endoscopy suite.                       - ASA Grade Assessment: II - A patient with mild                        systemic disease.                       -  After reviewing the risks and benefits, the patient                        was deemed in satisfactory condition to undergo the                        procedure.                       After obtaining informed consent, the colonoscope was                        passed  under direct vision. Throughout the procedure,                        the patient's blood pressure, pulse, and oxygen                        saturations were monitored continuously. The                        Colonoscope was introduced through the anus and                        advanced to the the terminal ileum. The colonoscopy was                        performed with ease. The patient tolerated the                        procedure well. The quality of the bowel preparation                        was good. Findings:      The perianal and digital rectal examinations were normal.      A patchy area of mildly erythematous mucosa was found in the rectum.       Biopsies were taken with a cold forceps for histology.      The sigmoid colon, descending colon, transverse colon, ascending colon,       cecum and ileum appeared normal. Biopsies for histology were taken with       a cold forceps from the cecum, ascending colon, transverse colon,       descending colon and sigmoid colon for evaluation of microscopic       colitis. Biopsies were obtained in the terminal ileum with cold forceps       for histology.      The retroflexed view of the distal rectum and anal verge was normal and       showed no anal or rectal abnormalities.      Non-bleeding internal hemorrhoids were found during endoscopy.      Retroflexion in the rectum was not performed due to Narrow rectum. Impression:           - Erythematous mucosa in the rectum. Biopsied.                       - The sigmoid colon, descending colon, transverse                        colon, ascending colon, cecum and terminal ileum are  normal. Biopsied.                       - The distal rectum and anal verge are normal on                        retroflexion view.                       - Non-bleeding internal hemorrhoids.                       - Biopsies were obtained in the terminal ileum. Recommendation:       - Discharge  patient to home.                       - Resume previous diet.                       - Continue present medications.                       - Return to primary care physician as previously                        scheduled.                       - The findings and recommendations were discussed with                        the patient.                       - The findings and recommendations were discussed with                        the patient's family.                       - Await pathology results.                       - High fiber diet. Procedure Code(s):    --- Professional ---                       [phone], Colonoscopy, flexible; with biopsy, single or                        multiple Diagnosis Code(s):    --- Professional ---                       K62.89, Other specified diseases of anus and rectum                       K64.8, Other hemorrhoids                       R10.9, Unspecified abdominal pain                       R19.7, Diarrhea, unspecified CPT copyright 2019 American Medical Association. All rights reserved. The codes documented in this report are preliminary and upon coder review may  be revised to meet current  compliance requirements.  Melodie Bouillon, MD Michel Bickers B. Maximino Greenland MD, MD 03/11/2019 10:32:22 AM This report has been signed electronically. Number of Addenda: 0 Note Initiated On: 03/11/2019 8:31 AM Scope Withdrawal Time: 0 hours 11 minutes 55 seconds  Total Procedure Duration: 0 hours 17 minutes 9 seconds  Estimated Blood Loss: Estimated blood loss: none.      [facility]

## 2019-03-11 NOTE — Anesthesia Procedure Notes (Signed)
Performed by: Cook-Martin, Rochelle Nephew Pre-anesthesia Checklist: Patient identified, Emergency Drugs available, Suction available, Timeout performed and Patient being monitored Patient Re-evaluated:Patient Re-evaluated prior to induction Oxygen Delivery Method: Nasal cannula Preoxygenation: Pre-oxygenation with 100% oxygen Induction Type: IV induction Airway Equipment and Method: Bite block Placement Confirmation: CO2 detector and positive ETCO2       

## 2019-03-11 NOTE — Anesthesia Postprocedure Evaluation (Signed)
Anesthesia Post Note  Patient: Olivia Sullivan  Procedure(s) Performed: COLONOSCOPY WITH PROPOFOL (N/A ) ESOPHAGOGASTRODUODENOSCOPY (EGD) WITH PROPOFOL (N/A )  Patient location during evaluation: Endoscopy Anesthesia Type: General Level of consciousness: awake and alert Pain management: pain level controlled Vital Signs Assessment: post-procedure vital signs reviewed and stable Respiratory status: spontaneous breathing, nonlabored ventilation, respiratory function stable and patient connected to nasal cannula oxygen Cardiovascular status: blood pressure returned to baseline and stable Postop Assessment: no apparent nausea or vomiting Anesthetic complications: no     Last Vitals:  Vitals:   03/11/19 0951 03/11/19 1005  BP: 110/71 (!) 99/49  Pulse: 68 75  Resp: 16 16  Temp:    SpO2:      Last Pain:  Vitals:   03/11/19 1005  TempSrc:   PainSc: 0-No pain                 Nichoals Heyde S

## 2019-03-12 ENCOUNTER — Encounter: Payer: Self-pay | Admitting: Gastroenterology

## 2019-03-13 LAB — SURGICAL PATHOLOGY

## 2019-03-15 ENCOUNTER — Encounter: Payer: Self-pay | Admitting: Gastroenterology

## 2019-03-17 ENCOUNTER — Ambulatory Visit: Payer: BLUE CROSS/BLUE SHIELD | Admitting: Gastroenterology

## 2019-03-17 ENCOUNTER — Ambulatory Visit (INDEPENDENT_AMBULATORY_CARE_PROVIDER_SITE_OTHER): Payer: BC Managed Care – PPO | Admitting: Gastroenterology

## 2019-03-17 DIAGNOSIS — K591 Functional diarrhea: Secondary | ICD-10-CM

## 2019-03-17 NOTE — Progress Notes (Signed)
Vonda Antigua, MD [address]  [place]  [place], [place] [address]  Main: [phone]  Fax: [phone]   Primary Care Physician: Hubbard Hartshorn, FNP  Virtual Visit via Video Note  I connected with patient on 03/17/19 at  1:00 PM EDT by video (using [email or web address]) and verified that I am speaking with the correct person using two identifiers.   I discussed the limitations, risks, security and privacy concerns of performing an evaluation and management service by video and the availability of in person appointments. I also discussed with the patient that there may be a patient responsible charge related to this service. The patient expressed understanding and agreed to proceed.  Location of Patient: Home Location of Provider: Home Persons involved: Patient and provider only (Nursing staff checked in patient via phone but were not physically involved in the video interaction - see their notes)   History of Present Illness: Chief Complaint  Patient presents with  . Follow-up    procedure    HPI: Ilene Witcher is a 23 y.o. female here for follow-up of diarrhea.  Patient underwent EGD and colonoscopy for abdominal pain, nausea and diarrhea which were unrevealing for an etiology of her symptoms with normal biopsies of the duodenum, and random colon.  Now identifies possible dairy products as triggers of her diarrhea.  She has not tried eliminating this so far.  The Bentyl prescribed by her PCP helped mildly, but she does not take it frequently.  Current Outpatient Medications  Medication Sig Dispense Refill  . dicyclomine (BENTYL) 10 MG capsule Take 1 capsule (10 mg total) by mouth 3 (three) times daily as needed for spasms. 30 capsule 0  . escitalopram (LEXAPRO) 10 MG tablet Take 2 tablets (20 mg total) by mouth daily. 90 tablet 0  . norgestimate-ethinyl estradiol (ORTHO-CYCLEN) 0.25-35 MG-MCG tablet Take 1 tablet by mouth daily.    . ondansetron (ZOFRAN) 4 MG tablet Take  1 tablet (4 mg total) by mouth every 8 (eight) hours as needed for nausea or vomiting. 20 tablet 0   No current facility-administered medications for this visit.     Allergies as of 03/17/2019  . (No Known Allergies)    Review of Systems:    All systems reviewed and negative except where noted in HPI.   Observations/Objective:  Labs: CMP     Component Value Date/Time   NA 139 02/02/2019 0910   K 3.9 02/02/2019 0910   CL 105 02/02/2019 0910   CO2 25 02/02/2019 0910   GLUCOSE 100 (H) 02/02/2019 0910   BUN 14 02/02/2019 0910   CREATININE 0.73 02/02/2019 0910   CALCIUM 9.2 02/02/2019 0910   PROT 6.9 02/02/2019 0910   AST 17 02/02/2019 0910   ALT 14 02/02/2019 0910   BILITOT 0.3 02/02/2019 0910   GFRNONAA 118 02/02/2019 0910   GFRAA 136 02/02/2019 0910   Lab Results  Component Value Date   WBC 9.1 02/02/2019   HGB 13.1 02/02/2019   HCT 40.1 02/02/2019   MCV 84.4 02/02/2019   PLT 422 (H) 02/02/2019    Imaging Studies: No results found.  Assessment and Plan:   Laurissa Cowper is a 23 y.o. y/o female with abdominal pain and diarrhea  Assessment and Plan: Given her anxiety and depression, and negative work-up so far including labs, stool studies and EGD and colonoscopy, patient symptoms are likely functional in nature next  However, she is now reporting possible dairy products as triggering her symptoms  I have  thus advised her to completely take off all dairy products including milk, cheese, ice cream etc. await for at least 2 weeks and monitor for changes in symptoms.  If her symptoms are improved, she may be lactose intolerant  No evidence of celiac disease on duodenal biopsies  I have also asked her to start taking Metamucil to help bulk her stool to see if it helps with her diarrhea  No alarm symptoms present  Patient to call us as needed, or if worsening or not improving symptoms, or with any questions or concerns  Follow Up Instructions: Follow-up in 6  months or earlier as needed   I discussed the assessment and treatment plan with the patient. The patient was provided an opportunity to ask questions and all were answered. The patient agreed with the plan and demonstrated an understanding of the instructions.   The patient was advised to call back or seek an in-person evaluation if the symptoms worsen or if the condition fails to improve as anticipated.  I provided 15 minutes of face-to-face time via video software during this encounter. Additional time was spent in reviewing patient's chart, placing orders etc.   Pasty SpillersVarnita B Chiara Coltrin, MD  Speech recognition software was used to dictate this note.

## 2019-03-31 ENCOUNTER — Encounter: Payer: BLUE CROSS/BLUE SHIELD | Admitting: Family Medicine

## 2019-04-05 ENCOUNTER — Other Ambulatory Visit: Payer: Self-pay | Admitting: Family Medicine

## 2019-04-05 DIAGNOSIS — F332 Major depressive disorder, recurrent severe without psychotic features: Secondary | ICD-10-CM

## 2019-04-05 DIAGNOSIS — F411 Generalized anxiety disorder: Secondary | ICD-10-CM

## 2019-04-05 NOTE — Telephone Encounter (Signed)
Pt called in to request a refill on escitalopram (LEXAPRO) 10 MG tablet    Pharmacy: [id number] - [address], [place]

## 2019-04-06 MED ORDER — ESCITALOPRAM OXALATE 10 MG PO TABS: 20.0000 mg | ORAL_TABLET | Freq: Every day | ORAL | 0 refills | Status: DC

## 2019-04-30 DIAGNOSIS — F5105 Insomnia due to other mental disorder: Secondary | ICD-10-CM | POA: Diagnosis not present

## 2019-04-30 DIAGNOSIS — F4312 Post-traumatic stress disorder, chronic: Secondary | ICD-10-CM | POA: Diagnosis not present

## 2019-04-30 DIAGNOSIS — F39 Unspecified mood [affective] disorder: Secondary | ICD-10-CM | POA: Diagnosis not present

## 2019-04-30 DIAGNOSIS — F411 Generalized anxiety disorder: Secondary | ICD-10-CM | POA: Diagnosis not present

## 2019-05-17 ENCOUNTER — Encounter: Payer: Self-pay | Admitting: Family Medicine

## 2019-05-17 ENCOUNTER — Telehealth: Payer: Self-pay | Admitting: Family Medicine

## 2019-05-17 ENCOUNTER — Emergency Department
Admission: EM | Admit: 2019-05-17 | Discharge: 2019-05-17 | Disposition: A | Discharge status: Home / Self Care | Payer: BC Managed Care – PPO | Attending: Emergency Medicine | Admitting: Emergency Medicine

## 2019-05-17 ENCOUNTER — Encounter: Payer: Self-pay | Admitting: Emergency Medicine

## 2019-05-17 ENCOUNTER — Emergency Department: Payer: BC Managed Care – PPO

## 2019-05-17 ENCOUNTER — Other Ambulatory Visit: Payer: Self-pay

## 2019-05-17 ENCOUNTER — Ambulatory Visit (INDEPENDENT_AMBULATORY_CARE_PROVIDER_SITE_OTHER): Payer: BC Managed Care – PPO | Admitting: Family Medicine

## 2019-05-17 VITALS — BP 118/72 | HR 93 | Temp 97.8°F | Resp 16 | Ht 65.0 in | Wt 156.0 lb

## 2019-05-17 DIAGNOSIS — F410 Panic disorder [episodic paroxysmal anxiety] without agoraphobia: Secondary | ICD-10-CM | POA: Diagnosis not present

## 2019-05-17 DIAGNOSIS — Z833 Family history of diabetes mellitus: Secondary | ICD-10-CM | POA: Diagnosis not present

## 2019-05-17 DIAGNOSIS — R3589 Other polyuria: Secondary | ICD-10-CM

## 2019-05-17 DIAGNOSIS — R631 Polydipsia: Secondary | ICD-10-CM | POA: Diagnosis not present

## 2019-05-17 DIAGNOSIS — R55 Syncope and collapse: Secondary | ICD-10-CM

## 2019-05-17 DIAGNOSIS — R519 Headache, unspecified: Secondary | ICD-10-CM

## 2019-05-17 DIAGNOSIS — R51 Headache: Secondary | ICD-10-CM

## 2019-05-17 DIAGNOSIS — R358 Other polyuria: Secondary | ICD-10-CM | POA: Insufficient documentation

## 2019-05-17 DIAGNOSIS — R0600 Dyspnea, unspecified: Secondary | ICD-10-CM | POA: Diagnosis not present

## 2019-05-17 DIAGNOSIS — R000 Tachycardia, unspecified: Secondary | ICD-10-CM | POA: Diagnosis not present

## 2019-05-17 DIAGNOSIS — R197 Diarrhea, unspecified: Secondary | ICD-10-CM | POA: Insufficient documentation

## 2019-05-17 DIAGNOSIS — R0689 Other abnormalities of breathing: Secondary | ICD-10-CM | POA: Diagnosis not present

## 2019-05-17 DIAGNOSIS — R064 Hyperventilation: Secondary | ICD-10-CM | POA: Diagnosis not present

## 2019-05-17 DIAGNOSIS — R112 Nausea with vomiting, unspecified: Secondary | ICD-10-CM

## 2019-05-17 DIAGNOSIS — R1110 Vomiting, unspecified: Secondary | ICD-10-CM | POA: Insufficient documentation

## 2019-05-17 LAB — COMPREHENSIVE METABOLIC PANEL
ALT: 20 U/L (ref 0–44)
AST: 26 U/L (ref 15–41)
Albumin: 4.2 g/dL (ref 3.5–5.0)
Alkaline Phosphatase: 89 U/L (ref 38–126)
Anion gap: 13 (ref 5–15)
BUN: 13 mg/dL (ref 6–20)
CO2: 19 mmol/L — ABNORMAL LOW (ref 22–32)
Calcium: 9.4 mg/dL (ref 8.9–10.3)
Chloride: 108 mmol/L (ref 98–111)
Creatinine, Ser: 0.74 mg/dL (ref 0.44–1.00)
GFR calc Af Amer: >60 mL/min (ref >60)
GFR calc non Af Amer: >60 mL/min (ref >60)
Glucose, Bld: 141 mg/dL — ABNORMAL HIGH (ref 70–99)
Potassium: 3.8 mmol/L (ref 3.5–5.1)
Sodium: 140 mmol/L (ref 135–145)
Total Bilirubin: 0.5 mg/dL (ref 0.3–1.2)
Total Protein: 7.9 g/dL (ref 6.5–8.1)

## 2019-05-17 LAB — CBC WITH DIFFERENTIAL/PLATELET
Abs Immature Granulocytes: 0.03 10*3/uL (ref 0.00–0.07)
Basophils Absolute: 0.1 10*3/uL (ref 0.0–0.1)
Basophils Relative: 1 %
Eosinophils Absolute: 0 10*3/uL (ref 0.0–0.5)
Eosinophils Relative: 0 %
HCT: 40.5 % (ref 36.0–46.0)
Hemoglobin: 13.4 g/dL (ref 12.0–15.0)
Immature Granulocytes: 0 %
Lymphocytes Relative: 16 %
Lymphs Abs: 1.4 10*3/uL (ref 0.7–4.0)
MCH: 27.9 pg (ref 26.0–34.0)
MCHC: 33.1 g/dL (ref 30.0–36.0)
MCV: 84.2 fL (ref 80.0–100.0)
Monocytes Absolute: 0.6 10*3/uL (ref 0.1–1.0)
Monocytes Relative: 7 %
Neutro Abs: 6.9 10*3/uL (ref 1.7–7.7)
Neutrophils Relative %: 76 %
Platelets: 384 10*3/uL (ref 150–400)
RBC: 4.81 MIL/uL (ref 3.87–5.11)
RDW: 12.8 % (ref 11.5–15.5)
WBC: 9 10*3/uL (ref 4.0–10.5)
nRBC: 0 % (ref 0.0–0.2)

## 2019-05-17 LAB — TSH: TSH: 2.958 u[IU]/mL (ref 0.350–4.500)

## 2019-05-17 LAB — GLUCOSE, POCT (MANUAL RESULT ENTRY): POC Glucose: 99 mg/dl (ref 70–99)

## 2019-05-17 MED ORDER — SODIUM CHLORIDE 0.9 % IV SOLN
Freq: Once | INTRAVENOUS | Status: AC
  Administered 2019-05-17: 10:00:00 via INTRAVENOUS

## 2019-05-17 MED ORDER — LORAZEPAM 2 MG/ML IJ SOLN
1.0000 mg | Freq: Once | INTRAMUSCULAR | Status: AC
  Administered 2019-05-17: 1 mg via INTRAVENOUS
  Filled 2019-05-17: qty 1

## 2019-05-17 MED ORDER — ONDANSETRON HCL 4 MG/2ML IJ SOLN
4.0000 mg | Freq: Once | INTRAMUSCULAR | Status: AC
  Administered 2019-05-17: 10:00:00 4 mg via INTRAVENOUS
  Filled 2019-05-17: qty 2

## 2019-05-17 MED ORDER — ONDANSETRON HCL 4 MG PO TABS: 4.0000 mg | ORAL_TABLET | Freq: Three times a day (TID) | ORAL | 0 refills | Status: DC | PRN

## 2019-05-17 MED ORDER — LORAZEPAM 1 MG PO TABS: 1.0000 mg | ORAL_TABLET | Freq: Two times a day (BID) | ORAL | 0 refills | Status: DC

## 2019-05-17 NOTE — ED Triage Notes (Addendum)
Arrives from [facility] for evaluation of hyperventilation and one month history of polydypsia and polyuria-- per EMS. EMS states CBG- 99. Patient is AAOx3.  Patient states she has history of severe anxiety .  Also c/o 2 day hisotry of vomiting and diarrhea.

## 2019-05-17 NOTE — Telephone Encounter (Signed)
Please call to check in on the patient after her ER visit.  We need to schedule a follow up in the next week or so to complete her evaluation and to discuss her anxiety and panic attack.    **Please call the [facility] Lab to see if they can add on an A1C to the labs drawn 05/17/2019 in the ER.**

## 2019-05-17 NOTE — ED Notes (Signed)
Patient denies pain and is resting comfortably.  

## 2019-05-17 NOTE — ED Notes (Signed)
AAOx3.  Skin warm and dry.  NAD 

## 2019-05-17 NOTE — ED Provider Notes (Signed)
[facility] Emergency Department Provider Note       Time seen: ----------------------------------------- 9:32 AM on 05/17/2019 -----------------------------------------   I have reviewed the triage vital signs and the nursing notes.  HISTORY   Chief Complaint Hyperventilating    HPI Olivia Sullivan is a 23 y.o. female with a history of anxiety, depression, migraines who presents to the ED for hyperventilation with 1 month of polyuria and polydipsia.  EMS states CBG is 99.  Patient reports history of severe anxiety but has never had hyperventilation.  She reports a 2-day history of vomiting and diarrhea as well.  Past Medical History:  Diagnosis Date  . Anxiety   . Depression   . Migraine     Patient Active Problem List   Diagnosis Date Noted  . Generalized abdominal pain   . Rectum inflammation   . Internal hemorrhoids   . Diverticulosis of small intestine without hemorrhage   . Nausea and vomiting   . Lower abdominal pain 01/29/2019  . Diarrhea 01/29/2019  . GAD (generalized anxiety disorder) 01/29/2019  . Severe episode of recurrent major depressive disorder, without psychotic features ([place]) 01/29/2019  . Panic disorder with agoraphobia 06/19/2018    Past Surgical History:  Procedure Laterality Date  . COLONOSCOPY WITH PROPOFOL N/A 03/11/2019   Procedure: COLONOSCOPY WITH PROPOFOL;  Surgeon: Virgel Manifold, MD;  Location: [facility] ENDOSCOPY;  Service: Endoscopy;  Laterality: N/A;  . ESOPHAGOGASTRODUODENOSCOPY (EGD) WITH PROPOFOL N/A 03/11/2019   Procedure: ESOPHAGOGASTRODUODENOSCOPY (EGD) WITH PROPOFOL;  Surgeon: Virgel Manifold, MD;  Location: [facility] ENDOSCOPY;  Service: Endoscopy;  Laterality: N/A;  . NO PAST SURGERIES      Allergies Patient has no known allergies.  Social History Social History   Tobacco Use  . Smoking status: Never Smoker  . Smokeless tobacco: Never Used  Substance Use Topics  . Alcohol use: Never   Frequency: Never  . Drug use: Never   Review of Systems Constitutional: Negative for fever. Cardiovascular: Negative for chest pain. Respiratory: Negative for shortness of breath. Gastrointestinal: Negative for abdominal pain, positive for vomiting and diarrhea Musculoskeletal: Negative for back pain. Skin: Negative for rash. Neurological: Negative for headaches, focal weakness or numbness.  All systems negative/normal/unremarkable except as stated in the HPI  ____________________________________________   PHYSICAL EXAM:  VITAL SIGNS: ED Triage Vitals  Enc Vitals Group     BP      Pulse      Resp      Temp      Temp src      SpO2      Weight      Height      Head Circumference      Peak Flow      Pain Score      Pain Loc      Pain Edu?      Excl. in [place]?    Constitutional: Alert and oriented.  Anxious and hyperventilating Eyes: Conjunctivae are normal. Normal extraocular movements. ENT      Head: Normocephalic and atraumatic.      Nose: No congestion/rhinnorhea.      Mouth/Throat: Mucous membranes are moist.      Neck: No stridor. Cardiovascular: Normal rate, regular rhythm. No murmurs, rubs, or gallops. Respiratory: Tachypnea with clear breath sounds Gastrointestinal: Soft and nontender. Normal bowel sounds Musculoskeletal: Nontender with normal range of motion in extremities. No lower extremity tenderness nor edema. Neurologic:  Normal speech and language. No gross focal neurologic deficits are appreciated.  Skin:  Skin is warm, dry and intact. No rash noted. Psychiatric: Anxious mood ____________________________________________  EKG: Interpreted by me.  Sinus rhythm with rate of 97 bpm, normal PR interval, normal QRS, normal QT  ____________________________________________  ED COURSE:  As part of my medical decision making, I reviewed the following data within the electronic MEDICAL RECORD NUMBER History obtained from family if available, nursing notes, old  chart and ekg, as well as notes from prior ED visits. Patient presented for hyperventilation, we will assess with labs and imaging as indicated at this time. Clinical Course as of May 17 1039  Mon May 17, 2019  1017 Patient is not tachypneic, resting comfortably   [JW]    Clinical Course User Index [JW] Emily FilbertWilliams, Laury Huizar E, MD   Procedures  Olivia RoysKaitlyn Sullivan was evaluated in Emergency Department on 05/17/2019 for the symptoms described in the history of present illness. She was evaluated in the context of the global COVID-19 pandemic, which necessitated consideration that the patient might be at risk for infection with the SARS-CoV-2 virus that causes COVID-19. Institutional protocols and algorithms that pertain to the evaluation of patients at risk for COVID-19 are in a state of rapid change based on information released by regulatory bodies including the CDC and federal and state organizations. These policies and algorithms were followed during the patient's care in the ED.  ____________________________________________   LABS (pertinent positives/negatives)  Labs Reviewed  CBC WITH DIFFERENTIAL/PLATELET  COMPREHENSIVE METABOLIC PANEL  TSH   EKG: Interpreted by me, sinus rhythm with rate of 97 bpm, normal PR interval, nonspecific ST segment changes, normal QT  RADIOLOGY  Chest x-ray Does not reveal any acute process ____________________________________________   DIFFERENTIAL DIAGNOSIS   Panic attack, dehydration, electrolyte abnormality  FINAL ASSESSMENT AND PLAN  Panic attack   Plan: The patient had presented for hyperventilation. Patient's labs are reassuring. Patient's imaging was unremarkable.  Patient was anxious and hyperventilating on arrival and received IV Ativan which resolved her symptoms.  She is currently calm and cooperative.  I will prescribe as needed Ativan to take as needed.  She is cleared for outpatient follow-up.   Ulice DashJohnathan E Mariame Rybolt, MD    Note: This  note was generated in part or whole with voice recognition software. Voice recognition is usually quite accurate but there are transcription errors that can and very often do occur. I apologize for any typographical errors that were not detected and corrected.     Emily FilbertWilliams, Mikaia Janvier E, MD 05/17/19 1041

## 2019-05-17 NOTE — ED Notes (Signed)
AAOx3.  Skin warm and dry.  Patient is hyperventilating.  Emotional reassurance given, moderately effective and effect is intermittent.  Patient's boyfriend at bedside to provide emotional support.  Continue to monitor.

## 2019-05-17 NOTE — Progress Notes (Addendum)
Name: Olivia Sullivan   MRN: [id number]    DOB: Apr 18, 1996   Date:05/17/2019       Progress Note  Subjective  Chief Complaint  Chief Complaint  Patient presents with  . Follow-up    needs lab work, family Hx of diabetes. Excessive thirst and urination, headaches    HPI  Pt presents with concern for headaches, polyuria and polydipsia along with some polyphagia for about 2 months.  No recent changes in her diet recently; though it was noted by GI in early June after episodes of blood in stool and diarrhea that she may have lactose intolerance.  Had 1 episode of vomiting this morning.  She also notes some lightheadedness along with some palpitations; sometimes feels near-syncopal. - Headaches are daily - notes history chronic migraines, but these are different - these headaches start after she eats and lasts for a few hours.  She is not drinking caffeine.  She is taking Excedrin every other day, but is not taking any other NSAID or tylenol. - She notes father and maternal grandmother both diagnosed in their 7's with T2DM.    Patient Active Problem List   Diagnosis Date Noted  . Generalized abdominal pain   . Rectum inflammation   . Internal hemorrhoids   . Diverticulosis of small intestine without hemorrhage   . Nausea and vomiting   . Lower abdominal pain 01/29/2019  . Diarrhea 01/29/2019  . GAD (generalized anxiety disorder) 01/29/2019  . Severe episode of recurrent major depressive disorder, without psychotic features ([place]) 01/29/2019  . Panic disorder with agoraphobia 06/19/2018    Past Surgical History:  Procedure Laterality Date  . COLONOSCOPY WITH PROPOFOL N/A 03/11/2019   Procedure: COLONOSCOPY WITH PROPOFOL;  Surgeon: Virgel Manifold, MD;  Location: [facility] ENDOSCOPY;  Service: Endoscopy;  Laterality: N/A;  . ESOPHAGOGASTRODUODENOSCOPY (EGD) WITH PROPOFOL N/A 03/11/2019   Procedure: ESOPHAGOGASTRODUODENOSCOPY (EGD) WITH PROPOFOL;  Surgeon: Virgel Manifold, MD;   Location: [facility] ENDOSCOPY;  Service: Endoscopy;  Laterality: N/A;  . NO PAST SURGERIES      Family History  Problem Relation Age of Onset  . Diabetes Father   . Depression Father   . Anxiety disorder Father   . Depression Sister   . Anxiety disorder Sister   . Irritable bowel syndrome Sister   . Depression Brother   . Anxiety disorder Brother   . Irritable bowel syndrome Brother   . Diabetes Maternal Grandmother     Social History   Socioeconomic History  . Marital status: Single    Spouse name: Not on file  . Number of children: 0  . Years of education: Not on file  . Highest education level: Not on file  Occupational History  . Occupation: Wellsite geologist  Social Needs  . Financial resource strain: Not hard at all  . Food insecurity    Worry: Never true    Inability: Never true  . Transportation needs    Medical: No    Non-medical: No  Tobacco Use  . Smoking status: Never Smoker  . Smokeless tobacco: Never Used  Substance and Sexual Activity  . Alcohol use: Never    Frequency: Never  . Drug use: Never  . Sexual activity: Yes    Partners: Male    Birth control/protection: Pill  Lifestyle  . Physical activity    Days per week: 0 days    Minutes per session: 0 min  . Stress: Not at all  Relationships  . Social connections  Talks on phone: More than three times a week    Gets together: More than three times a week    Attends religious service: Never    Active member of club or organization: No    Attends meetings of clubs or organizations: Never    Relationship status: Never married  . Intimate partner violence    Fear of current or ex partner: No    Emotionally abused: No    Physically abused: No    Forced sexual activity: No  Other Topics Concern  . Not on file  Social History Narrative   [place]ives with [place] in [place]; family and friends live in [place].  Working as a Psychologist, [employer] in [place] [employer] [employer]     Current Outpatient Medications:  .   escitalopram (LEXAPRO) 10 MG tablet, Take 2 tablets (20 mg total) by mouth daily., Disp: 60 tablet, Rfl: 0 .  norgestimate-ethinyl estradiol (ORTHO-CYCLEN) 0.25-35 MG-MCG tablet, Take 1 tablet by mouth daily., Disp: , Rfl:  .  ondansetron (ZOFRAN) 4 MG tablet, Take 1 tablet (4 mg total) by mouth every 8 (eight) hours as needed for nausea or vomiting. (Patient not taking: Reported on 05/17/2019), Disp: 20 tablet, Rfl: 0  No Known Allergies  I personally reviewed active problem list, medication list, allergies, notes from last encounter, lab results with the patient/caregiver today.  ROS  Ten systems reviewed and is negative except as mentioned in HPI  Objective  Vitals:   05/17/19 0758  BP: 118/72  Pulse: 93  Resp: 16  Temp: 97.8 F (36.6 C)  TempSrc: Oral  SpO2: 94%  Weight: 156 lb (70.8 kg)  Height: 5\' 5"  (1.651 m)   Body mass index is 25.96 kg/m.  Physical Exam  Constitutional: Patient appears well-developed and well-nourished. No distress.  HENT: Head: Normocephalic and atraumatic. Ears: bilateral TMs with no erythema or effusion; Nose: Nose normal. Mouth/Throat: Oropharynx is clear and moist. No oropharyngeal exudate or tonsillar swelling.  Eyes: Conjunctivae and EOM are normal. No scleral icterus. Neck: Normal range of motion. Neck supple. No JVD present. No thyromegaly present.  Cardiovascular: Tachycardic rate, regular rhythm and normal heart sounds.  No murmur heard. No BLE edema. Pulmonary/Chest: Effort normal and breath sounds normal. No respiratory distress. Abdominal: Soft. Bowel sounds are normal, no distension. There is no tenderness. No masses. Musculoskeletal: Normal range of motion, no joint effusions. No gross deformities Neurological: Pt is alert and oriented to person, place, and time. No cranial nerve deficit. Coordination, balance, strength, speech and gait are normal.  Skin: Skin is warm and dry. No rash noted. No erythema.  Psychiatric: Patient has a  normal mood and affect. behavior is normal. Judgment and thought content normal.  No results found for this or any previous visit (from the past 72 hour(s)).  PHQ2/9: Depression screen [facility] 05/17/2019 02/16/2019 01/29/2019 01/29/2019  Decreased Interest 2 2 2  0  Down, Depressed, Hopeless 1 2 2  0  PHQ - 2 Score 3 4 4  0  Altered sleeping 3 3 3  0  Tired, decreased energy 3 3 2  0  Change in appetite 2 3 2  0  Feeling bad or failure about yourself  1 3 3  0  Trouble concentrating 3 3 2  0  Moving slowly or fidgety/restless 0 3 2 0  Suicidal thoughts 1 2 2  0  PHQ-9 Score 16 24 20  0  Difficult doing work/chores Somewhat difficult Extremely dIfficult Very difficult Not difficult at all   PHQ-2/9 Result is negative.    Fall  Risk: Fall Risk  05/17/2019 02/16/2019 01/29/2019  Falls in the past year? 0 0 0  Number falls in past yr: 0 0 0  Injury with Fall? 0 0 0  Follow up Falls evaluation completed Falls evaluation completed Falls evaluation completed   Assessment & Plan  1. Non-intractable vomiting with nausea, unspecified vomiting type - ondansetron (ZOFRAN) 4 MG tablet; Take 1 tablet (4 mg total) by mouth every 8 (eight) hours as needed for nausea or vomiting.  Dispense: 20 tablet; Refill: 0 - CBC with Differential/Platelet; Future - Comprehensive metabolic panel; Future  2. Family history of diabetes mellitus - Hemoglobin A1c; Future - Comprehensive metabolic panel; Future  3. Polyuria - Hemoglobin A1c; Future - Comprehensive metabolic panel; Future  4. Polydipsia - Hemoglobin A1c; Future - Comprehensive metabolic panel; Future  5. Nonintractable episodic headache, unspecified headache type - ondansetron (ZOFRAN) 4 MG tablet; Take 1 tablet (4 mg total) by mouth every 8 (eight) hours as needed for nausea or vomiting.  Dispense: 20 tablet; Refill: 0 - CBC with Differential/Platelet; Future - Hemoglobin A1c; Future - Comprehensive metabolic panel; Future  6. Tachycardia - EKG:  Shows sinus tachycardia.  - CBC with Differential/Platelet; Future - Comprehensive metabolic panel; Future  During the history collection, the patient becomes mildly diaphoretic, shaky, and has rapid breathing at about 24/min RR. Her CBG is 99 (despite fasting this morning).  HR approx 115, 98% pulse ox, BP is 126/82 EKG shows sinus tachycardia. She remained oriented during this evaluation, however she states she feels like she is going to pass out multiple times over the course of about 20-1930minutes, she continues to breath rapidly and complains of body aching.  The decision is made that she is not stable to be transferred by POV, and 911 is called for EMS transport. DDX:  Panic attack, Hypoglycemic episode w/ T2DM, near syncope with unknown cause, infectious cause(?).  Fire dept arrived at [phone], EMS arrived at [phone]

## 2019-05-19 ENCOUNTER — Other Ambulatory Visit: Payer: Self-pay | Admitting: Emergency Medicine

## 2019-05-19 DIAGNOSIS — R3589 Other polyuria: Secondary | ICD-10-CM

## 2019-05-19 DIAGNOSIS — R631 Polydipsia: Secondary | ICD-10-CM

## 2019-05-19 DIAGNOSIS — R358 Other polyuria: Secondary | ICD-10-CM

## 2019-05-19 LAB — HEMOGLOBIN A1C
Hgb A1c MFr Bld: 4.9 % (ref 4.8–5.6)
Mean Plasma Glucose: 93.93 mg/dL

## 2019-05-19 NOTE — Telephone Encounter (Signed)
Good - signed A1C order, this is pending.

## 2019-05-19 NOTE — Telephone Encounter (Signed)
Feeling better, still have some nausea but the anxiety has died down

## 2019-05-22 DIAGNOSIS — F39 Unspecified mood [affective] disorder: Secondary | ICD-10-CM | POA: Diagnosis not present

## 2019-05-22 DIAGNOSIS — F5105 Insomnia due to other mental disorder: Secondary | ICD-10-CM | POA: Diagnosis not present

## 2019-05-22 DIAGNOSIS — F411 Generalized anxiety disorder: Secondary | ICD-10-CM | POA: Diagnosis not present

## 2019-05-22 DIAGNOSIS — F4312 Post-traumatic stress disorder, chronic: Secondary | ICD-10-CM | POA: Diagnosis not present

## 2019-06-01 ENCOUNTER — Encounter: Payer: Self-pay | Admitting: Family Medicine

## 2019-06-05 DIAGNOSIS — F5105 Insomnia due to other mental disorder: Secondary | ICD-10-CM | POA: Diagnosis not present

## 2019-06-05 DIAGNOSIS — F411 Generalized anxiety disorder: Secondary | ICD-10-CM | POA: Diagnosis not present

## 2019-06-05 DIAGNOSIS — F39 Unspecified mood [affective] disorder: Secondary | ICD-10-CM | POA: Diagnosis not present

## 2019-06-05 DIAGNOSIS — F4312 Post-traumatic stress disorder, chronic: Secondary | ICD-10-CM | POA: Diagnosis not present

## 2019-07-19 ENCOUNTER — Ambulatory Visit: Payer: BC Managed Care – PPO | Admitting: Certified Nurse Midwife

## 2019-07-19 ENCOUNTER — Encounter: Payer: Self-pay | Admitting: Certified Nurse Midwife

## 2019-07-19 ENCOUNTER — Other Ambulatory Visit: Payer: Self-pay

## 2019-07-19 ENCOUNTER — Other Ambulatory Visit (HOSPITAL_COMMUNITY)
Admission: RE | Admit: 2019-07-19 | Discharge: 2019-07-19 | Disposition: A | Discharge status: Home / Self Care | Payer: BC Managed Care – PPO | Source: Ambulatory Visit | Attending: Certified Nurse Midwife | Admitting: Certified Nurse Midwife

## 2019-07-19 VITALS — BP 86/61 | HR 68 | Ht 65.0 in | Wt 158.0 lb

## 2019-07-19 DIAGNOSIS — R32 Unspecified urinary incontinence: Secondary | ICD-10-CM

## 2019-07-19 DIAGNOSIS — N941 Unspecified dyspareunia: Secondary | ICD-10-CM | POA: Insufficient documentation

## 2019-07-19 DIAGNOSIS — N898 Other specified noninflammatory disorders of vagina: Secondary | ICD-10-CM

## 2019-07-19 DIAGNOSIS — R300 Dysuria: Secondary | ICD-10-CM | POA: Diagnosis not present

## 2019-07-19 LAB — POCT URINALYSIS DIPSTICK
Bilirubin, UA: NEGATIVE
Blood, UA: NEGATIVE
Glucose, UA: NEGATIVE
Ketones, UA: NEGATIVE
Nitrite, UA: NEGATIVE
Protein, UA: NEGATIVE
Spec Grav, UA: 1.015 (ref 1.010–1.025)
Urobilinogen, UA: 0.2 E.U./dL
pH, UA: 7.5 (ref 5.0–8.0)

## 2019-07-19 NOTE — Progress Notes (Signed)
Patient c/o "really bad burning" when urinating and during intercourse x3 years.

## 2019-07-19 NOTE — Patient Instructions (Addendum)
Dyspareunia, Female Dyspareunia is pain that is associated with sexual activity. This can affect any part of the genitals or lower abdomen. There are many possible causes of this condition. In some cases, diagnosing the cause of dyspareunia can be difficult. This condition can be mild, moderate, or severe. Depending on the cause, dyspareunia may get better with treatment, but may return (recur) over time. What are the causes?  The cause of this condition is not always known. However, problems that affect the vulva, vagina, uterus, and other organs may cause dyspareunia. Common causes of this condition include:  Vaginal dryness.  Giving birth.  Infection.  Skin changes or conditions.  Side effects of medicines.  Endometriosis. This is when tissue that is like the lining of the uterus grows on the outside of the uterus.  Psychological conditions. These include depression, anxiety, or traumatic experiences.  Allergic reaction. What increases the risk? The following factors may make you more likely to develop this condition:  History of physical or sexual trauma.  Some medicines.  No longer having a monthly period (menopause).  Having recently given birth.  Taking baths using soaps that have perfumes. These can cause irritation.  Douching. What are the signs or symptoms? The main symptom of this condition is pain in any part of your genitals or lower abdomen during or after sex. This may include:  Irritation, burning, or stinging sensations in your vulva.  Discomfort when your vulva or surrounding area is touched.  Aching and throbbing pain that may be constant.  Pain that gets worse when something is inserted into your vagina. How is this diagnosed? This condition may be diagnosed based on:  Your symptoms, including where and when your pain occurs.  Your medical history.  A physical exam. A pelvic exam will most likely be done.  Tests that include ultrasound,  blood tests, and tests that check the body for infection.  Imaging tests, such as X-ray, MRI, and CT scan. You may be referred to a health care provider who specializes in women's health (gynecologist). How is this treated? Treatment depends on the cause of your condition and your symptoms. In most cases, you may need to stop sexual activity until your symptoms go away or get better. Treatment may include:  Lubricants, ointments, and creams.  Physical therapy.  Massage therapy.  Hormonal therapy.  Medicines to: ? Prevent or fight infection. ? Relieve pain. ? Help numb the area. ? Treat depression (antidepressants).  Counseling, which may include sex therapy.  Surgery. Follow these instructions at home: Lifestyle  Wear cotton underwear.  Use water-based lubricants as needed during sex. Avoid oil-based lubricants.  Do not use any products that can cause irritation. This may include certain condoms, spermicides, lubricants, soaps, tampons, vaginal sprays, or douches.  Always practice safe sex. Use a condom to prevent sexually transmitted infections (STIs).  Talk freely with your partner about your condition. General instructions  Take or apply over-the-counter and prescription medicines only as told by your health care provider.  Urinate before you have sex.  Consider joining a support group.  Get the results of any tests you have done. Ask your health care provider, or the department that is doing the procedure, when your results will be ready.  Keep all follow-up visits as told by your health care provider. This is important. Contact a health care provider if:  You have vaginal bleeding after having sex.  You develop a lump at the opening of your vagina even if the  lump is painless.  You have: ? Abnormal discharge from your vagina. ? Vaginal dryness. ? Itchiness or irritation of your vulva or vagina. ? A new rash. ? Symptoms that get worse or do not improve  with treatment. ? A fever. ? Pain when you urinate. ? Blood in your urine. Get help right away if:  You have severe pain in your abdomen during or shortly after sex.  You pass out after sex. Summary  Dyspareunia is pain that is associated with sexual activity. This can affect any part of the genitals or lower abdomen.  There are many causes of this condition. Treatment depends on the cause and your symptoms. In most cases, you may need to stop sexual activity until your symptoms improve.  Take or apply over-the-counter and prescription medicines only as told by your health care provider.  Contact a health care provider if your symptoms get worse or do not improve with treatment.  Keep all follow-up visits as told by your health care provider. This is important. This information is not intended to replace advice given to you by your health care provider. Make sure you discuss any questions you have with your health care provider. Document Released: 10/13/2007 Document Revised: 11/30/2018 Document Reviewed: 11/30/2018 Elsevier Patient Education  [employer]. Dysuria Dysuria is pain or discomfort while urinating. The pain or discomfort may be felt in the part of your body that drains urine from the bladder (urethra) or in the surrounding tissue of the genitals. The pain may also be felt in the groin area, lower abdomen, or lower back. You may have to urinate frequently or have the sudden feeling that you have to urinate (urgency). Dysuria can affect both men and women, but it is more common in women. Dysuria can be caused by many different things, including:  Urinary tract infection.  Kidney stones or bladder stones.  Certain sexually transmitted infections (STIs), such as chlamydia.  Dehydration.  Inflammation of the tissues of the vagina.  Use of certain medicines.  Use of certain soaps or scented products that cause irritation. Follow these instructions at  home: General instructions  Watch your condition for any changes.  Urinate often. Avoid holding urine for long periods of time.  After a bowel movement or urination, women should cleanse from front to back, using each tissue only once.  Urinate after sexual intercourse.  Keep all follow-up visits as told by your health care provider. This is important.  If you had any tests done to find the cause of dysuria, it is up to you to get your test results. Ask your health care provider, or the department that is doing the test, when your results will be ready. Eating and drinking   Drink enough fluid to keep your urine pale yellow.  Avoid caffeine, tea, and alcohol. They can irritate the bladder and make dysuria worse. In men, alcohol may irritate the prostate. Medicines  Take over-the-counter and prescription medicines only as told by your health care provider.  If you were prescribed an antibiotic medicine, take it as told by your health care provider. Do not stop taking the antibiotic even if you start to feel better. Contact a health care provider if:  You have a fever.  You develop pain in your back or sides.  You have nausea or vomiting.  You have blood in your urine.  You are not urinating as often as you usually do. Get help right away if:  Your pain is severe  and not relieved with medicines.  You cannot eat or drink without vomiting.  You are confused.  You have a rapid heartbeat while at rest.  You have shaking or chills.  You feel extremely weak. Summary  Dysuria is pain or discomfort while urinating. Many different conditions can lead to dysuria.  If you have dysuria, you may have to urinate frequently or have the sudden feeling that you have to urinate (urgency).  Watch your condition for any changes. Keep all follow-up visits as told by your health care provider.  Make sure that you urinate often and drink enough fluid to keep your urine pale  yellow. This information is not intended to replace advice given to you by your health care provider. Make sure you discuss any questions you have with your health care provider. Document Released: 06/21/2004 Document Revised: 09/05/2017 Document Reviewed: 07/10/2017 Elsevier Patient Education  2020 Olivia Sullivan 37-3 Years Old, Female Preventive care refers to visits with your health care provider and lifestyle choices that can promote health and wellness. This includes:  A yearly physical exam. This may also be called an annual well check.  Regular dental visits and eye exams.  Immunizations.  Screening for certain conditions.  Healthy lifestyle choices, such as eating a healthy diet, getting regular exercise, not using drugs or products that contain nicotine and tobacco, and limiting alcohol use. What can I expect for my preventive care visit? Physical exam Your health care provider will check your:  Height and weight. This may be used to calculate body mass index (BMI), which tells if you are at a healthy weight.  Heart rate and blood pressure.  Skin for abnormal spots. Counseling Your health care provider may ask you questions about your:  Alcohol, tobacco, and drug use.  Emotional well-being.  Home and relationship well-being.  Sexual activity.  Eating habits.  Work and work Statistician.  Method of birth control.  Menstrual cycle.  Pregnancy history. What immunizations do I need?  Influenza (flu) vaccine  This is recommended every year. Tetanus, diphtheria, and pertussis (Tdap) vaccine  You may need a Td booster every 10 years. Varicella (chickenpox) vaccine  You may need this if you have not been vaccinated. Human papillomavirus (HPV) vaccine  If recommended by your health care provider, you may need three doses over 6 months. Measles, mumps, and rubella (MMR) vaccine  You may need at least one dose of MMR. You may also need a  second dose. Meningococcal conjugate (MenACWY) vaccine  One dose is recommended if you are age 61-21 years and a first-year college student living in a residence hall, or if you have one of several medical conditions. You may also need additional booster doses. Pneumococcal conjugate (PCV13) vaccine  You may need this if you have certain conditions and were not previously vaccinated. Pneumococcal polysaccharide (PPSV23) vaccine  You may need one or two doses if you smoke cigarettes or if you have certain conditions. Hepatitis A vaccine  You may need this if you have certain conditions or if you travel or work in places where you may be exposed to hepatitis A. Hepatitis B vaccine  You may need this if you have certain conditions or if you travel or work in places where you may be exposed to hepatitis B. Haemophilus influenzae type b (Hib) vaccine  You may need this if you have certain conditions. You may receive vaccines as individual doses or as more than one vaccine together in one shot (  combination vaccines). Talk with your health care provider about the risks and benefits of combination vaccines. What tests do I need?  Blood tests  Lipid and cholesterol levels. These may be checked every 5 years starting at age 44.  Hepatitis C test.  Hepatitis B test. Screening  Diabetes screening. This is done by checking your blood sugar (glucose) after you have not eaten for a while (fasting).  Sexually transmitted disease (STD) testing.  BRCA-related cancer screening. This may be done if you have a family history of breast, ovarian, tubal, or peritoneal cancers.  Pelvic exam and Pap test. This may be done every 3 years starting at age 4. Starting at age 12, this may be done every 5 years if you have a Pap test in combination with an HPV test. Talk with your health care provider about your test results, treatment options, and if necessary, the need for more tests. Follow these  instructions at home: Eating and drinking   Eat a diet that includes fresh fruits and vegetables, whole grains, lean protein, and low-fat dairy.  Take vitamin and mineral supplements as recommended by your health care provider.  Do not drink alcohol if: ? Your health care provider tells you not to drink. ? You are pregnant, may be pregnant, or are planning to become pregnant.  If you drink alcohol: ? Limit how much you have to 0-1 drink a day. ? Be aware of how much alcohol is in your drink. In the [place] one drink equals one 12 oz bottle of beer (355 mL), one 5 oz glass of wine (148 mL), or one 1 oz glass of hard liquor (44 mL). Lifestyle  Take daily care of your teeth and gums.  Stay active. Exercise for at least 30 minutes on 5 or more days each week.  Do not use any products that contain nicotine or tobacco, such as cigarettes, e-cigarettes, and chewing tobacco. If you need help quitting, ask your health care provider.  If you are sexually active, practice safe sex. Use a condom or other form of birth control (contraception) in order to prevent pregnancy and STIs (sexually transmitted infections). If you plan to become pregnant, see your health care provider for a preconception visit. What's next?  Visit your health care provider once a year for a well check visit.  Ask your health care provider how often you should have your eyes and teeth checked.  Stay up to date on all vaccines. This information is not intended to replace advice given to you by your health care provider. Make sure you discuss any questions you have with your health care provider. Document Released: 11/19/2001 Document Revised: 06/04/2018 Document Reviewed: 06/04/2018 Elsevier Patient Education  2020 [employer].

## 2019-07-19 NOTE — Progress Notes (Signed)
GYN ENCOUNTER NOTE  Subjective:       Olivia Sullivan is a 23 y.o. G0P0000 female is here for gynecologic evaluation of the following issues:   1. Dysuria and dyspareunia for the last three (3) years-reports "really bad burning" with urination and insertion of penis into vaginal opening; worse burning or pain during orgasm  2. New onset green vaginal discharge and incontinence (leakage of urine) for the last few days  Has been treatment for UTIs in the past. Felt like other providers were just "throwing antibiotics at her that didn't relieve symptoms".  Questions need for Urology referral.   Doesn't use lubricant with intercourse. Burning is the same in all positions.   Denies difficulty breathing or respiratory distress, chest pain, abdominal pain, excessive vaginal bleeding, dysuria, and leg pain or swelling.    Gynecologic History  Patient's last menstrual period was 06/27/2019 (exact date). Period Cycle (Days): 28 Period Duration (Days): 6 Period Pattern: Regular Menstrual Flow: Moderate Menstrual Control: Tampon Dysmenorrhea: (!) Mild Dysmenorrhea Symptoms: Cramping  Contraception: OCP (estrogen/progesterone), Ortho-Cyclen  Last Pap: due  Obstetric History  OB History  Gravida Para Term Preterm AB Living  0 0 0 0 0 0  SAB TAB Ectopic Multiple Live Births  0 0 0 0 0    Past Medical History:  Diagnosis Date  . Anxiety   . Depression   . Migraine     Past Surgical History:  Procedure Laterality Date  . COLONOSCOPY WITH PROPOFOL N/A 03/11/2019   Procedure: COLONOSCOPY WITH PROPOFOL;  Surgeon: Virgel Manifold, MD;  Location: [facility];  Service: Endoscopy;  Laterality: N/A;  . ESOPHAGOGASTRODUODENOSCOPY (EGD) WITH PROPOFOL N/A 03/11/2019   Procedure: ESOPHAGOGASTRODUODENOSCOPY (EGD) WITH PROPOFOL;  Surgeon: Virgel Manifold, MD;  Location: [facility];  Service: Endoscopy;  Laterality: N/A;  . NO PAST SURGERIES      Current Outpatient Medications on  File Prior to Visit  Medication Sig Dispense Refill  . norgestimate-ethinyl estradiol (ORTHO-CYCLEN) 0.25-35 MG-MCG tablet Take 1 tablet by mouth daily.     No current facility-administered medications on file prior to visit.     No Known Allergies  Social History   Socioeconomic History  . Marital status: Significant Other    Spouse name: Not on file  . Number of children: 0  . Years of education: Not on file  . Highest education level: Not on file  Occupational History  . Occupation: Wellsite geologist  Social Needs  . Financial resource strain: Not hard at all  . Food insecurity    Worry: Never true    Inability: Never true  . Transportation needs    Medical: No    Non-medical: No  Tobacco Use  . Smoking status: Never Smoker  . Smokeless tobacco: Never Used  Substance and Sexual Activity  . Alcohol use: Yes    Frequency: Never    Comment: occasionally  . Drug use: Never  . Sexual activity: Yes    Partners: Male    Birth control/protection: Pill  Lifestyle  . Physical activity    Days per week: 0 days    Minutes per session: 0 min  . Stress: Not at all  Relationships  . Social connections    Talks on phone: More than three times a week    Gets together: More than three times a week    Attends religious service: Never    Active member of club or organization: No    Attends meetings of clubs or organizations: Never  Relationship status: Never married  . Intimate partner violence    Fear of current or ex partner: No    Emotionally abused: No    Physically abused: No    Forced sexual activity: No  Other Topics Concern  . Not on file  Social History Narrative   Lives with Boyfriend in [place]; family and friends live in [place].  Working as a Psychologist, forensic in [employer]    Family History  Problem Relation Age of Onset  . Diabetes Father   . Depression Father   . Anxiety disorder Father   . Depression Sister   . Anxiety disorder Sister   . Irritable bowel  syndrome Sister   . Depression Brother   . Anxiety disorder Brother   . Irritable bowel syndrome Brother   . Diabetes Maternal Grandmother   . Breast cancer Neg Hx   . Ovarian cancer Neg Hx   . Colon cancer Neg Hx     The following portions of the patient's history were reviewed and updated as appropriate: allergies, current medications, past family history, past medical history, past social history, past surgical history and problem list.  Review of Systems  ROS negative except as noted above. Information obtained from patient.   Objective:   BP (!) 86/61   Pulse 68   Ht 5\' 5"  (1.651 m)   Wt 158 lb (71.7 kg)   LMP 06/27/2019 (Exact Date)   BMI 26.29 kg/m    CONSTITUTIONAL: Well-developed, well-nourished female in no acute distress.   PELVIC:  External Genitalia: Normal except short distance from urethral meatus to vaginal opening  Vagina: Normal  Cervix: White discharge present, Swab collected   MUSCULOSKELETAL: Normal range of motion. No tenderness.  No cyanosis, clubbing, or edema.  Assessment:   1. Dysuria  - Urine Culture - POCT urinalysis dipstick - Ambulatory referral to Urology  2. Dyspareunia in female  - Cervicovaginal ancillary only - Ambulatory referral to Physical Therapy  3. Urinary incontinence, unspecified type  - Ambulatory referral to Urology - Ambulatory referral to Physical Therapy  4. Vaginal discharge   Plan:   Vaginal swab collected, will contact patient with results.   Urine culture, see orders.   Discussed complementary treatment options including pelvic floor physical therapy, see orders.   Will refer to Urology per patient request, see orders.   Reviewed red flag symptoms and when to call.   RTC for ANNUAL EXAM, PAP, and ultrasound.    06/29/2019, CNM [facility], [facility] 07/19/19 3:30 PM

## 2019-07-21 LAB — URINE CULTURE: Organism ID, Bacteria: NO GROWTH

## 2019-07-23 LAB — CERVICOVAGINAL ANCILLARY ONLY
Bacterial Vaginitis (gardnerella): NEGATIVE
Candida Glabrata: NEGATIVE
Candida Vaginitis: NEGATIVE
Chlamydia: NEGATIVE
Comment: NEGATIVE
Comment: NEGATIVE
Comment: NEGATIVE
Comment: NEGATIVE
Comment: NEGATIVE
Comment: NORMAL
Neisseria Gonorrhea: NEGATIVE
Trichomonas: NEGATIVE

## 2019-07-28 ENCOUNTER — Other Ambulatory Visit: Payer: Self-pay

## 2019-07-28 DIAGNOSIS — Z20822 Contact with and (suspected) exposure to covid-19: Secondary | ICD-10-CM

## 2019-07-29 LAB — NOVEL CORONAVIRUS, NAA: SARS-CoV-2, NAA: NOT DETECTED

## 2019-08-10 ENCOUNTER — Encounter: Payer: BC Managed Care – PPO | Admitting: Certified Nurse Midwife

## 2019-08-10 ENCOUNTER — Ambulatory Visit: Payer: BC Managed Care – PPO | Attending: Certified Nurse Midwife

## 2019-08-10 ENCOUNTER — Other Ambulatory Visit: Payer: Self-pay

## 2019-08-10 DIAGNOSIS — N3946 Mixed incontinence: Secondary | ICD-10-CM | POA: Insufficient documentation

## 2019-08-10 DIAGNOSIS — R293 Abnormal posture: Secondary | ICD-10-CM | POA: Insufficient documentation

## 2019-08-10 DIAGNOSIS — R2689 Other abnormalities of gait and mobility: Secondary | ICD-10-CM | POA: Diagnosis not present

## 2019-08-10 DIAGNOSIS — M62838 Other muscle spasm: Secondary | ICD-10-CM

## 2019-08-10 NOTE — Patient Instructions (Signed)
Stabilization: Diaphragmatic Breathing    Lie with knees bent, feet flat. Place one hand on stomach, other on chest. Breathe deeply through nose, lifting belly hand without any motion of hand on chest. Repeat __10__ times per set. Do __2__ sets per session. Perform every day 2x a day.   You can also perform this in a seated position.

## 2019-08-10 NOTE — Therapy (Signed)
[facility] [address] [place], [place], [address] Phone: [phone]   Fax:  [phone]  Physical Therapy Evaluation The patient has been informed of current processes in place at Outpatient Rehab to protect patients from Covid-19 exposure including social distancing, schedule modifications, and new cleaning procedures. After discussing their particular risk with a therapist based on the patient's personal risk factors, the patient has decided to proceed with in-person therapy.  Patient Details  Name: Olivia Sullivan MRN: [id number] Date of Birth: 17-Jan-1996 Referring Provider (PT): Rose PhiMichelle Lowhorn CNM    Encounter Date: 08/10/2019  PT End of Session - 08/11/19 1031    Visit Number  1    Number of Visits  10    Date for PT Re-Evaluation  10/19/19    Authorization - Visit Number  1    Authorization - Number of Visits  10    PT Start Time  1530    PT Stop Time  1630    PT Time Calculation (min)  60 min    Activity Tolerance  Patient tolerated treatment well;No increased pain    Behavior During Therapy  WFL for tasks assessed/performed       Past Medical History:  Diagnosis Date  . Anxiety   . Depression   . Migraine     Past Surgical History:  Procedure Laterality Date  . COLONOSCOPY WITH PROPOFOL N/A 03/11/2019   Procedure: COLONOSCOPY WITH PROPOFOL;  Surgeon: Pasty Spillersahiliani, Varnita B, MD;  Location: [facility];  Service: Endoscopy;  Laterality: N/A;  . ESOPHAGOGASTRODUODENOSCOPY (EGD) WITH PROPOFOL N/A 03/11/2019   Procedure: ESOPHAGOGASTRODUODENOSCOPY (EGD) WITH PROPOFOL;  Surgeon: Pasty Spillersahiliani, Varnita B, MD;  Location: [facility];  Service: Endoscopy;  Laterality: N/A;  . NO PAST SURGERIES      There were no vitals filed for this visit.    Pelvic Floor Physical Therapy Evaluation and Assessment  SCREENING  Falls in last 6 mo: No   Interpreter Needed?: No  Interpreter Agency:  Interpreter  Name  Interpreter ID  Patient Declined Interpreter   Patient signed [place] waiver    Patient's communication preference: English   Red Flags:  Have you had any night sweats? No  Unexplained weight loss? No  Saddle anesthesia? No  Unexplained changes in bowel or bladder habits? No   SUBJECTIVE  Patient reports: Having a lot of "burning" pain with sex and urination. It got worse over the last few years. Has pain with inserting a tampon. Endometriosis does run in her family. Is a Data processing managervet assistant right now. Has LBP for about 10 years. Sitting down it's not "too bad" but increases with standing and activity. After about 20 minutes of walking brings it on. About 30 minutes into her work day. Takes pain killers to elevate pain - over the counter ibeprophen- takes them about 1 day per week. LBP increases with menstrual cycle.  Hip displasia runs in her family. Has tried yoga before and didn't like it because she had an increase in pain. Willing to try it with guidance. 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    Precautions:  Strong family history of hip dysplasia and endometriosis  Social/Family/Vocational History:   Data processing managerVet assistant- 3 years.   Recent Procedures/Tests/Findings:  In the future vaginal ultrasound for endo (Nov 5th)   -urethra is closer to vaginal opening  Obstetrical History: G0P0  Gynecological History: ? endo  Urinary History: 1-2x every hour. Feelings of incomplete voiding. Present  for about 10 years.  Drinks quite a bit of water.  Burning sensation present with urination and all day.   (+) for SUI and UUI (mixed presentation)   occ'l blood in urine (-) for UTI  Pain at vulva: Burning sensation: 6/10 on NPS  Max: 8/10 on NPS  Min: 1-2/10 - something is always present  Ice assists. Few times a week. After trying intercourse.   Gastrointestinal History: No strain with bowel movements; 1x BW/day   Sexual activity/pain: - Penetration and deeper  thrusting.  - after orgasm- have to ice (with sex and and independence stimulation)   Location of pain 1 : LBP (chronic)  Current pain:  4/10  Max pain:  7/10 Least pain:  0/10 - only if sitting down for long enough.  Nature of pain: sharp pain at the base of her spine. Both sides even.   Location of pain 2 B hip (R >L)  Current pain: 3/10 on NPS  Max pain: 7-8/10 on NPS  Least: 2/10 on NPS (dull)  With walking the R hip with pop after about 20 minutes - sharp pain   Patient Goals: To be pain free. The most aggravating is with urination.    OBJECTIVE  Posture/Observations:  Sitting: rounded shoulders, posterior pelvic tilt, thoracic kyphosis  Standing: R up-slip Supine- R ankle eversion at rest, R up-slip and anterior innominate rotation.    Palpation/Segmental Motion/Joint Play: TTP R>L illiacus and adductors TTP to L1 paraspinals on L;  B at L5/S1 TTP to R piriformis; glute med/min**  Flexed coccyx to the R   Special tests:   Supine -to -long sit: L LE longer in both LLD: L 83.5cm vs R 83cm  Stork test- + for glute med weakness   Range of Motion/Flexibilty:  Spine: with forwards flexion: knees hyperextended  Hips: L rotation L hip pain (brought on); R side bending ~75% of L ROM; R outflare in supine; L LE longer in supine.    Strength/MMT:  Deferred to follow up  LE MMT  LE MMT Left Right  Hip flex:  (L2) /5 /5  Hip ext: /5 /5  Hip abd: /5 /5  Hip add: /5 /5  Hip IR /5 /5  Hip ER /5 /5     Abdominal:  Palpation: lower quadrant facial tightness  Diastasis: no DR present.   Pelvic Floor-- deferred to follow-up  External Exam:  Introitus Appears:  Skin integrity:  Palpation: Cough: Prolapse visible?: Scar mobility:  Internal Vaginal Exam: Strength (PERF):  Symmetry: Palpation: Prolapse:   Internal Rectal Exam: Strength (PERF): Symmetry: Palpation: Prolapse:   Gait Analysis: slow cadence.  0.107m/s slow gait speed for age group, higher  risk for falls.    Pelvic Floor Outcome Measures: 34/44 Female NIH; 23/100 - indicating high risk for sexual dysfunction and decreased QOL    INTERVENTIONS THIS SESSION: Self care: Educated on the structure and function of the pelvic floor in relation to their symptoms as well as the POC, and initial HEP in order to set patient expectations and understanding from which we will build on in the future sessions. (10 minutes)  Therex: Diphramatic breathing in seated position for improved diaphragm to PFM relationship and reduction of accessory muscle breathing. Patient handout given for HEP. (10 minutes)     Total time: 60 minutes       [facility] PT Assessment - 08/11/19 0001      Assessment   Medical Diagnosis  dysperunia, dysuria     Referring  Provider (PT)  Bluford Main CNM     Prior Therapy  No, negative yoga experience       Balance Screen   Has the patient fallen in the past 6 months  No    Has the patient had a decrease in activity level because of a fear of falling?   Yes    Is the patient reluctant to leave their home because of a fear of falling?   No      Home Environment   Living Environment  Private residence    Living Arrangements  Spouse/significant other    Type of [place] to enter    Entrance Stairs-Number of Steps  12    Entrance Stairs-Rails  Left    Home Layout  Two level      Prior Function   Level of Independence  Independent    Vocation  --   Runner, broadcasting/film/video Requirements  heavy lifting      Cognition   Overall Cognitive Status  Within Functional Limits for tasks assessed    Behaviors  --   decreased affect               Objective measurements completed on examination: See above findings.                PT Short Term Goals - 08/11/19 1052      PT SHORT TERM GOAL #1   Title  Patient will demonstrate a coordinated contraction, relaxation, and bulge of the pelvic floor muscles to  demonstrate functional recruitment and motion and allow for further strengthening.    Baseline  paradoxical TA activation with breathing    Time  5    Period  Weeks    Status  New    Target Date  09/14/19      PT SHORT TERM GOAL #2   Title  Patient will demonstrate appropriate body mechanics with ambulation to allow for decreased stress on the pelvic floor and low back.    Baseline  antalgic, slow gait    Time  5    Period  Weeks    Status  New    Target Date  09/14/19      PT SHORT TERM GOAL #3   Title  Patient will report a reduction in pain to no greater than 5/10 over the prior week to demonstrate symptom improvement.    Baseline  7-8/10 on NPS at LBP, hips and vulva    Time  5    Period  Weeks    Status  New    Target Date  09/14/19        PT Long Term Goals - 08/11/19 1056      PT LONG TERM GOAL #1   Title  Patient will report no episodes of mixed UI over the course of the prior two weeks to demonstrate improved functional ability.    Baseline  mixed UI- urgency and stress leakage every day    Time  10    Period  Weeks    Status  New    Target Date  10/19/19      PT LONG TERM GOAL #2   Title  Patient will demonstrate improvement on Female Sexual Function Index (FSFI) from 23 to 46 demonstrating improved QOL with sexual functions.    Baseline  23/100- indicating high risk for sexual dysfunction    Time  10  Period  Weeks    Status  New    Target Date  10/19/19      PT LONG TERM GOAL #3   Title  Patient will score less than or equal to 20% on the Female NIH-CPSI to demonstrate a reduction in pain, urinary symptoms, and an improved quality of life.    Baseline  34/44 (78%)    Time  10    Period  Weeks    Status  New    Target Date  10/19/19      PT LONG TERM GOAL #4   Title  Patient will describe pain no greater than 2/10 during ADL's, work, and following intercourse to demonstrate improved functional ability.    Baseline  7-8/10 on NPS at LBP, hips and  vulva increased by above activities.    Time  10    Period  Weeks    Status  New    Target Date  10/19/19             Plan - 08/11/19 1031    Clinical Impression Statement  Patient is a 23 y.o female who arrived to PT with CC of chronic dysparunia, dysuria, mixed incontience, and possible vaginismis. Patient has a relevant PMH of close proxmity of urethra opening and vagina canal, PTSD, generalized personlity/disorder, headaches/migraines, depression/anxiety. Clinical assessment revealed R anterior innominate rotation, R up-slip, hip/LBP, decreased gait speed, multiple muscle spasms surrounding the pelvis. Patient will benefit from skilled pelvic health PT to address listed signs and symptoms affecting QOL.    Personal Factors and Comorbidities  Behavior Pattern;Comorbidity 2;Past/Current Experience;Time since onset of injury/illness/exacerbation    Comorbidities  anxiety/depression/generalized personality/disorder; headaches/migraines.    Examination-Activity Limitations  Sit;Stand;Toileting;Continence;Hygiene/Grooming    Examination-Participation Restrictions  Interpersonal Relationship;Community Activity    Stability/Clinical Decision Making  Unstable/Unpredictable    Clinical Decision Making  High    Rehab Potential  Good    PT Frequency  2x / week    PT Duration  --   10 weeks   PT Treatment/Interventions  ADLs/Self Care Home Management;Biofeedback;Electrical Stimulation;Moist Heat;Traction;Ultrasound;Stair training;Functional mobility training;Therapeutic activities;Gait training;Balance training;Therapeutic exercise;Neuromuscular re-education;Patient/family education;Manual techniques;Dry needling;Energy conservation;Spinal Manipulations;Joint Manipulations    PT Next Visit Plan  *give patient advanced directives handout; address pain/ TTP; improve pelvic alignment; determine LLD when pelvic alignment is improved; when appropriate address external PFM    PT Home Exercise Plan   diaphragmatic breathing in seated and hook-lying;    Recommended Other Services  Vaginal valium?    Consulted and Agree with Plan of Care  Patient       Patient will benefit from skilled therapeutic intervention in order to improve the following deficits and impairments:  Abnormal gait, Decreased activity tolerance, Decreased balance, Decreased coordination, Decreased endurance, Decreased mobility, Decreased range of motion, Decreased strength, Increased muscle spasms, Increased fascial restricitons, Postural dysfunction, Improper body mechanics, Pain  Visit Diagnosis: Mixed incontinence  Other muscle spasm  Abnormal posture  Other abnormalities of gait and mobility     Problem List Patient Active Problem List   Diagnosis Date Noted  . Generalized abdominal pain   . Rectum inflammation   . Internal hemorrhoids   . Diverticulosis of small intestine without hemorrhage   . Nausea and vomiting   . Lower abdominal pain 01/29/2019  . Diarrhea 01/29/2019  . GAD (generalized anxiety disorder) 01/29/2019  . Severe episode of recurrent major depressive disorder, without psychotic features (HCC) 01/29/2019  . Panic disorder with agoraphobia 06/19/2018   Olivia Sullivan, SPT  Olivia Sullivan 08/11/2019, 3:15 PM  [facility] MAIN [facility] SERVICES [address] [place], [place], [address] Phone: [phone]   Fax:  [phone]  Name: Olivia Sullivan MRN: [id number] Date of Birth: [birth year]

## 2019-08-12 ENCOUNTER — Other Ambulatory Visit (HOSPITAL_COMMUNITY)
Admission: RE | Admit: 2019-08-12 | Discharge: 2019-08-12 | Disposition: A | Discharge status: Home / Self Care | Payer: BC Managed Care – PPO | Source: Ambulatory Visit | Attending: Certified Nurse Midwife | Admitting: Certified Nurse Midwife

## 2019-08-12 ENCOUNTER — Other Ambulatory Visit: Payer: Self-pay

## 2019-08-12 ENCOUNTER — Ambulatory Visit (INDEPENDENT_AMBULATORY_CARE_PROVIDER_SITE_OTHER): Payer: BC Managed Care – PPO | Admitting: Certified Nurse Midwife

## 2019-08-12 ENCOUNTER — Encounter: Payer: Self-pay | Admitting: Certified Nurse Midwife

## 2019-08-12 VITALS — BP 95/53 | HR 77 | Ht 65.0 in | Wt 159.0 lb

## 2019-08-12 DIAGNOSIS — Z124 Encounter for screening for malignant neoplasm of cervix: Secondary | ICD-10-CM

## 2019-08-12 DIAGNOSIS — Z01419 Encounter for gynecological examination (general) (routine) without abnormal findings: Secondary | ICD-10-CM

## 2019-08-12 DIAGNOSIS — N942 Vaginismus: Secondary | ICD-10-CM

## 2019-08-12 NOTE — Progress Notes (Signed)
ANNUAL PREVENTATIVE CARE GYN  ENCOUNTER NOTE  Subjective:       Olivia Sullivan is a 23 y.o. G0P0000 female here for a routine annual gynecologic exam.  Current complaints: 1. Needs Pap smear 2. Request vaginal valium for treatment for vaginismus per DPT  Denies difficulty breathing or respiratory distress, chest pain, abdominal pain, excessive vaginal bleeding, and leg pain or swelling.    Gynecologic History  Patient's last menstrual period was 07/27/2019 (exact date). Period Cycle (Days): 28 Period Duration (Days): 6 Period Pattern: Regular Menstrual Flow: Moderate Menstrual Control: Tampon Dysmenorrhea: (!) Moderate Dysmenorrhea Symptoms: Cramping  Contraception: OCP (estrogen/progesterone), Ortho-Cyclen  Last Pap: due.   Obstetric History  OB History  Gravida Para Term Preterm AB Living  0 0 0 0 0 0  SAB TAB Ectopic Multiple Live Births  0 0 0 0 0    Past Medical History:  Diagnosis Date  . Anxiety   . Depression   . Migraine     Past Surgical History:  Procedure Laterality Date  . COLONOSCOPY WITH PROPOFOL N/A 03/11/2019   Procedure: COLONOSCOPY WITH PROPOFOL;  Surgeon: [name], MD;  Location: [facility] ENDOSCOPY;  Service: Endoscopy;  Laterality: N/A;  . ESOPHAGOGASTRODUODENOSCOPY (EGD) WITH PROPOFOL N/A 03/11/2019   Procedure: ESOPHAGOGASTRODUODENOSCOPY (EGD) WITH PROPOFOL;  Surgeon: [name], MD;  Location: [facility] ENDOSCOPY;  Service: Endoscopy;  Laterality: N/A;    Current Outpatient Medications on File Prior to Visit  Medication Sig Dispense Refill  . norgestimate-ethinyl estradiol (ORTHO-CYCLEN) 0.25-35 MG-MCG tablet Take 1 tablet by mouth daily.     No current facility-administered medications on file prior to visit.     No Known Allergies  Social History   Socioeconomic History  . Marital status: Significant Other    Spouse name: Not on file  . Number of children: 0  . Years of education: Not on file  . Highest education level:  Not on file  Occupational History  . Occupation: Psychologist, forensic  Social Needs  . Financial resource strain: Not hard at all  . Food insecurity    Worry: Never true    Inability: Never true  . Transportation needs    Medical: No    Non-medical: No  Tobacco Use  . Smoking status: Never Smoker  . Smokeless tobacco: Never Used  Substance and Sexual Activity  . Alcohol use: Yes    Frequency: Never    Comment: occasionally  . Drug use: Never  . Sexual activity: Yes    Partners: Male    Birth control/protection: Pill  Lifestyle  . Physical activity    Days per week: 0 days    Minutes per session: 0 min  . Stress: Not at all  Relationships  . Social connections    Talks on phone: More than three times a week    Gets together: More than three times a week    Attends religious service: Never    Active member of club or organization: No    Attends meetings of clubs or organizations: Never    Relationship status: Never married  . Intimate partner violence    Fear of current or ex partner: No    Emotionally abused: No    Physically abused: No    Forced sexual activity: No  Other Topics Concern  . Not on file  Social History Narrative   Lives with Boyfriend in [place]; family and friends live in [place].  Working as a Psychologist, forensic in [employer]  History  Problem Relation Age of Onset  . Diabetes Father   . Depression Father   . Anxiety disorder Father   . Depression Sister   . Anxiety disorder Sister   . Irritable bowel syndrome Sister   . Depression Brother   . Anxiety disorder Brother   . Irritable bowel syndrome Brother   . Diabetes Maternal Grandmother   . Endometriosis Maternal Grandmother   . Endometriosis Maternal Aunt   . Breast cancer Neg Hx   . Ovarian cancer Neg Hx   . Colon cancer Neg Hx     The following portions of the patient's history were reviewed and updated as appropriate: allergies, current medications, past family history, past medical  history, past social history, past surgical history and problem list.  Review of Systems  ROS negative except as noted above. Information obtained from patient.    Objective:   BP (!) 95/53   Pulse 77   Ht 5\' 5"  (1.651 m)   Wt 159 lb (72.1 kg)   LMP 07/27/2019 (Exact Date)   BMI 26.46 kg/m    CONSTITUTIONAL: Well-developed, well-nourished female in no acute distress.   PSYCHIATRIC: Normal mood and affect. Normal behavior. Normal judgment and thought content.  [place]: Alert and oriented to person, place, and time. Normal muscle tone coordination. No cranial nerve deficit noted.  HENT:  Normocephalic, atraumatic, External right and left ear normal.   EYES: Conjunctivae and EOM are normal. Pupils are equal and round.   NECK: Normal range of motion, supple, no masses.  Normal thyroid.   SKIN: Skin is warm and dry. No rash noted. Not diaphoretic. No erythema. No pallor.  CARDIOVASCULAR: Normal heart rate noted, regular rhythm, no murmur.  RESPIRATORY: Clear to auscultation bilaterally. Effort and breath sounds normal, no problems with respiration noted.  BREASTS: Symmetric in size. No masses, skin changes, nipple drainage, or lymphadenopathy.  ABDOMEN: Soft, normal bowel sounds, no distention noted.  No tenderness, rebound or guarding.   PELVIC:  External Genitalia: Normal except short distance from urethral meatus to vaginal opening  Vagina: Normal  Cervix: Normal, Pap collected   MUSCULOSKELETAL: Normal range of motion. No tenderness.  No cyanosis, clubbing, or edema.  2+ distal pulses.  LYMPHATIC: No Axillary, Supraclavicular, or Inguinal Adenopathy.  Assessment:   Annual gynecologic examination 23 y.o.   Contraception: OCP (estrogen/progesterone), Ortho-Cyclen   Overweight   Problem List Items Addressed This Visit    None    Visit Diagnoses    Well woman exam    -  Primary   Relevant Orders   Cytology - PAP (Completed)   Screening for cervical cancer        Relevant Orders   Cytology - PAP (Completed)   Vaginismus       Relevant Orders   Monitor Drug Profile 14(MW)-Drug screen with confirmation      Plan:   Pap: Pap, Reflex if ASCUS  Labs: Declined   Declines flu vaccine  Routine preventative health maintenance measures emphasized: Exercise/Diet/Weight control, Tobacco Warnings, Alcohol/Substance use risks, Stress Management and Peer Pressure Issues; see AVS  Prescription for vaginal valium with Letitia Libra, DPT. Agreed to prescribed 5-10 mg PV nightly for one (1) month follow by 5-10 mg PV three (3) times a week for up to 16 months. Will require drug contract and monthly urine testing   Drug contract sent via MyChart to patient. Will send Rx upon return of signed contract and completion of UDS, see orders  Reviewed red flag  symptoms and when to call  [facility] or sooner if needed   Gunnar BullaJenkins Michelle Jere Bostrom, CNM [facility], [facility] 08/16/19 6:00 PM

## 2019-08-12 NOTE — Patient Instructions (Signed)
Preventive Care 21-22 Years Old, Female Preventive care refers to visits with your health care provider and lifestyle choices that can promote health and wellness. This includes:  A yearly physical exam. This may also be called an annual well check.  Regular dental visits and eye exams.  Immunizations.  Screening for certain conditions.  Healthy lifestyle choices, such as eating a healthy diet, getting regular exercise, not using drugs or products that contain nicotine and tobacco, and limiting alcohol use. What can I expect for my preventive care visit? Physical exam Your health care provider will check your:  Height and weight. This may be used to calculate body mass index (BMI), which tells if you are at a healthy weight.  Heart rate and blood pressure.  Skin for abnormal spots. Counseling Your health care provider may ask you questions about your:  Alcohol, tobacco, and drug use.  Emotional well-being.  Home and relationship well-being.  Sexual activity.  Eating habits.  Work and work environment.  Method of birth control.  Menstrual cycle.  Pregnancy history. What immunizations do I need?  Influenza (flu) vaccine  This is recommended every year. Tetanus, diphtheria, and pertussis (Tdap) vaccine  You may need a Td booster every 10 years. Varicella (chickenpox) vaccine  You may need this if you have not been vaccinated. Human papillomavirus (HPV) vaccine  If recommended by your health care provider, you may need three doses over 6 months. Measles, mumps, and rubella (MMR) vaccine  You may need at least one dose of MMR. You may also need a second dose. Meningococcal conjugate (MenACWY) vaccine  One dose is recommended if you are age 19-21 years and a first-year college student living in a residence hall, or if you have one of several medical conditions. You may also need additional booster doses. Pneumococcal conjugate (PCV13) vaccine  You may need  this if you have certain conditions and were not previously vaccinated. Pneumococcal polysaccharide (PPSV23) vaccine  You may need one or two doses if you smoke cigarettes or if you have certain conditions. Hepatitis A vaccine  You may need this if you have certain conditions or if you travel or work in places where you may be exposed to hepatitis A. Hepatitis B vaccine  You may need this if you have certain conditions or if you travel or work in places where you may be exposed to hepatitis B. Haemophilus influenzae type b (Hib) vaccine  You may need this if you have certain conditions. You may receive vaccines as individual doses or as more than one vaccine together in one shot (combination vaccines). Talk with your health care provider about the risks and benefits of combination vaccines. What tests do I need?  Blood tests  Lipid and cholesterol levels. These may be checked every 5 years starting at age 20.  Hepatitis C test.  Hepatitis B test. Screening  Diabetes screening. This is done by checking your blood sugar (glucose) after you have not eaten for a while (fasting).  Sexually transmitted disease (STD) testing.  BRCA-related cancer screening. This may be done if you have a family history of breast, ovarian, tubal, or peritoneal cancers.  Pelvic exam and Pap test. This may be done every 3 years starting at age 21. Starting at age 30, this may be done every 5 years if you have a Pap test in combination with an HPV test. Talk with your health care provider about your test results, treatment options, and if necessary, the need for more tests.   Follow these instructions at home: Eating and drinking   Eat a diet that includes fresh fruits and vegetables, whole grains, lean protein, and low-fat dairy.  Take vitamin and mineral supplements as recommended by your health care provider.  Do not drink alcohol if: ? Your health care provider tells you not to drink. ? You are  pregnant, may be pregnant, or are planning to become pregnant.  If you drink alcohol: ? Limit how much you have to 0-1 drink a day. ? Be aware of how much alcohol is in your drink. In the [place] one drink equals one 12 oz bottle of beer (355 mL), one 5 oz glass of wine (148 mL), or one 1 oz glass of hard liquor (44 mL). Lifestyle  Take daily care of your teeth and gums.  Stay active. Exercise for at least 30 minutes on 5 or more days each week.  Do not use any products that contain nicotine or tobacco, such as cigarettes, e-cigarettes, and chewing tobacco. If you need help quitting, ask your health care provider.  If you are sexually active, practice safe sex. Use a condom or other form of birth control (contraception) in order to prevent pregnancy and STIs (sexually transmitted infections). If you plan to become pregnant, see your health care provider for a preconception visit. What's next?  Visit your health care provider once a year for a well check visit.  Ask your health care provider how often you should have your eyes and teeth checked.  Stay up to date on all vaccines. This information is not intended to replace advice given to you by your health care provider. Make sure you discuss any questions you have with your health care provider. Document Released: 11/19/2001 Document Revised: 06/04/2018 Document Reviewed: 06/04/2018 Elsevier Patient Education  2020 Elsevier Inc.  

## 2019-08-12 NOTE — Progress Notes (Signed)
Patient here for annual exam.  No complaints.  Declines flu shot.   

## 2019-08-16 LAB — CYTOLOGY - PAP: Diagnosis: NEGATIVE

## 2019-08-17 ENCOUNTER — Other Ambulatory Visit (INDEPENDENT_AMBULATORY_CARE_PROVIDER_SITE_OTHER): Payer: BC Managed Care – PPO | Admitting: Certified Nurse Midwife

## 2019-08-17 ENCOUNTER — Other Ambulatory Visit: Payer: BC Managed Care – PPO

## 2019-08-17 ENCOUNTER — Other Ambulatory Visit: Payer: Self-pay

## 2019-08-17 ENCOUNTER — Ambulatory Visit: Payer: BC Managed Care – PPO

## 2019-08-17 DIAGNOSIS — R293 Abnormal posture: Secondary | ICD-10-CM | POA: Diagnosis not present

## 2019-08-17 DIAGNOSIS — M62838 Other muscle spasm: Secondary | ICD-10-CM

## 2019-08-17 DIAGNOSIS — N942 Vaginismus: Secondary | ICD-10-CM

## 2019-08-17 DIAGNOSIS — R2689 Other abnormalities of gait and mobility: Secondary | ICD-10-CM | POA: Diagnosis not present

## 2019-08-17 DIAGNOSIS — Z0289 Encounter for other administrative examinations: Secondary | ICD-10-CM | POA: Diagnosis not present

## 2019-08-17 DIAGNOSIS — N3946 Mixed incontinence: Secondary | ICD-10-CM

## 2019-08-17 MED ORDER — DIAZEPAM 5 MG PO TABS: ORAL_TABLET | ORAL | 0 refills | Status: DC

## 2019-08-17 NOTE — Therapy (Signed)
[place] MAIN [facility] SERVICES [address] [place], [place], [address] Phone: [phone]   Fax:  [phone]  Physical Therapy Treatment The patient has been informed of current processes in place at Outpatient Rehab to protect patients from Covid-19 exposure including social distancing, schedule modifications, and new cleaning procedures. After discussing their particular risk with a therapist based on the patient's personal risk factors, the patient has decided to proceed with in-person therapy.  Patient Details  Name: Olivia Sullivan MRN: [id number] Date of Birth: 1996-04-11 Referring Provider (PT): Bluford Main CNM    Encounter Date: 08/17/2019  PT End of Session - 08/17/19 1639    Visit Number  2    Number of Visits  10    Date for PT Re-Evaluation  10/19/19    Authorization - Visit Number  2    Authorization - Number of Visits  10    PT Start Time  7035    PT Stop Time  1625    PT Time Calculation (min)  60 min    Activity Tolerance  Patient tolerated treatment well;No increased pain    Behavior During Therapy  WFL for tasks assessed/performed       Past Medical History:  Diagnosis Date  . Anxiety   . Depression   . Migraine     Past Surgical History:  Procedure Laterality Date  . COLONOSCOPY WITH PROPOFOL N/A 03/11/2019   Procedure: COLONOSCOPY WITH PROPOFOL;  Surgeon: Virgel Manifold, MD;  Location: [facility] ENDOSCOPY;  Service: Endoscopy;  Laterality: N/A;  . ESOPHAGOGASTRODUODENOSCOPY (EGD) WITH PROPOFOL N/A 03/11/2019   Procedure: ESOPHAGOGASTRODUODENOSCOPY (EGD) WITH PROPOFOL;  Surgeon: Virgel Manifold, MD;  Location: [facility] ENDOSCOPY;  Service: Endoscopy;  Laterality: N/A;  . NO PAST SURGERIES      There were no vitals filed for this visit.         Pelvic Floor Physical Therapy Treatment Note  SCREENING  Changes in medications, allergies, or medical history?: No    SUBJECTIVE  Patient  reports: Patient reports her symptoms have not changed, will get the vaginal valium hopefully tomorrow. Has done diaphragmatic breathing- "has not felt much of a difference". Reports every since her fall a few years ago she has R neck and radiating into shoulder pain. Tore a muscle at the time, and now aggravates most when she wakes up. Pt is asked if sleeping positions could help.   Precautions:  N/A   Pain update:  Location of pain 1 : LBP (chronic)  Current pain:  4/10  Max pain:  7/10 Least pain:  0/10 - only if sitting down for long enough.  Nature of pain: sharp pain at the base of her spine. Both sides even  Pain at vulva: Burning sensation: 4/10 on NPS  Max: 8/10 on NPS  Min: 1-2/10 - something is always present  Ice assists. Few times a week. After trying intercourse.   Location of pain 2 B hip (R >L)  Current pain: 3-4/10 on NPS  Max pain: 7-8/10 on NPS  Least: 2/10 on NPS (dull)  With walking the R hip with pop after about 20 minutes - sharp pain   -- 3/10 following manual therapy to R hip and 4-5/10 at LBP    Patient Goals: To be pain free. The most aggravating is with urination.    OBJECTIVE  Changes in: Posture/Observations:  R anterior innominate rotation; R up-slip    Range of Motion/Flexibilty:  N/A   Strength/MMT: N/A  LE MMT:  Pelvic floor:  N/A  Abdominal:  L Lower quadrant tension   Palpation: TTP to L illiacus, R glute med, R piriformis   Gait Analysis: N/A   INTERVENTIONS THIS SESSION: Manual Therapy: Manual TP release to R QL (with distraction). R glute med and pirfiormis with oscillations for decreased R hip pain. Followed by R up-slip correction. Cues for decreased QL engagement for decreased R hip pain. L illiacus TP release to to decrease spasm and pain and allow for improved balance of musculature for improved function and decreased symptoms. MET to correct R anterior innominate rotation correction (pt education on performance for  HEP. Moist heat at all areas to decrease sensitivity from muscle spasms. (30 minutes)   Therex: Progressed TA engagement this session and posterior chain. Trailed quadruped for increased TA engagement, progressed to seated pelvic tilts with breathing sequencing. B QL stretch (more to assist R vs L) to decrease tension on R side. Bridges performed for posterior chain engagement to decreased anterior pelvic tilt (increasing neutral pelvis) and strengthening hip stability for maintaining pelvic alignment.  (22 minutes)   Self Care: Sleeping positions in effort to decrease pain upon waking for improved pain management. Pain management strategies assessed such as seated pelvic tilts during the work day to decrease pain. (8 minutes)    Total time: 60 minutes        PT Short Term Goals - 08/11/19 1052      PT SHORT TERM GOAL #1   Title  Patient will demonstrate a coordinated contraction, relaxation, and bulge of the pelvic floor muscles to demonstrate functional recruitment and motion and allow for further strengthening.    Baseline  paradoxical TA activation with breathing    Time  5    Period  Weeks    Status  New    Target Date  09/14/19      PT SHORT TERM GOAL #2   Title  Patient will demonstrate appropriate body mechanics with ambulation to allow for decreased stress on the pelvic floor and low back.    Baseline  antalgic, slow gait    Time  5    Period  Weeks    Status  New    Target Date  09/14/19      PT SHORT TERM GOAL #3   Title  Patient will report a reduction in pain to no greater than 5/10 over the prior week to demonstrate symptom improvement.    Baseline  7-8/10 on NPS at LBP, hips and vulva    Time  5    Period  Weeks    Status  New    Target Date  09/14/19        PT Long Term Goals - 08/11/19 1056      PT LONG TERM GOAL #1   Title  Patient will report no episodes of mixed UI over the course of the prior two weeks to demonstrate improved functional ability.     Baseline  mixed UI- urgency and stress leakage every day    Time  10    Period  Weeks    Status  New    Target Date  10/19/19      PT LONG TERM GOAL #2   Title  Patient will demonstrate improvement on Female Sexual Function Index (FSFI) from 23 to 46 demonstrating improved QOL with sexual functions.    Baseline  23/100- indicating high risk for sexual dysfunction    Time  10  Period  Weeks    Status  New    Target Date  10/19/19      PT LONG TERM GOAL #3   Title  Patient will score less than or equal to 20% on the Female NIH-CPSI to demonstrate a reduction in pain, urinary symptoms, and an improved quality of life.    Baseline  34/44 (78%)    Time  10    Period  Weeks    Status  New    Target Date  10/19/19      PT LONG TERM GOAL #4   Title  Patient will describe pain no greater than 2/10 during ADL's, work, and following intercourse to demonstrate improved functional ability.    Baseline  7-8/10 on NPS at LBP, hips and vulva increased by above activities.    Time  10    Period  Weeks    Status  New    Target Date  10/19/19            Plan - 08/17/19 1640    Clinical Impression Statement  Pt. Responded well to all interventions today, demonstrating improved pelvic alignment, decreased muscle spasm tension, and improved posterior chain engagement, as well as understanding and correct performance of all education and exercises provided today. They will continue to benefit from skilled physical therapy to work toward remaining goals and maximize function as well as decrease likelihood of symptom increase or recurrence.    Personal Factors and Comorbidities  Behavior Pattern;Comorbidity 2;Past/Current Experience;Time since onset of injury/illness/exacerbation    Comorbidities  anxiety/depression/generalized personality/disorder; headaches/migraines.    Examination-Activity Limitations  Sit;Stand;Toileting;Continence;Hygiene/Grooming    Examination-Participation Restrictions   Interpersonal Relationship;Community Activity    Stability/Clinical Decision Making  Unstable/Unpredictable    Rehab Potential  Good    PT Frequency  2x / week    PT Duration  --   10 weeks   PT Treatment/Interventions  ADLs/Self Care Home Management;Biofeedback;Electrical Stimulation;Moist Heat;Traction;Ultrasound;Stair training;Functional mobility training;Therapeutic activities;Gait training;Balance training;Therapeutic exercise;Neuromuscular re-education;Patient/family education;Manual techniques;Dry needling;Energy conservation;Spinal Manipulations;Joint Manipulations    PT Next Visit Plan  address pain/ TTP; improve pelvic alignment; determine LLD when pelvic alignment is improved; when appropriate address external PFM; trial TENS for desensization.    PT Home Exercise Plan  diaphragmatic breathing in seated and hook-lying; side stretch (R>L); seated pelvic tilts; sleeping positions; R anterior innominate correction; bridges    Consulted and Agree with Plan of Care  Patient       Patient will benefit from skilled therapeutic intervention in order to improve the following deficits and impairments:  Abnormal gait, Decreased activity tolerance, Decreased balance, Decreased coordination, Decreased endurance, Decreased mobility, Decreased range of motion, Decreased strength, Increased muscle spasms, Increased fascial restricitons, Postural dysfunction, Improper body mechanics, Pain  Visit Diagnosis: Other muscle spasm  Mixed incontinence  Abnormal posture  Other abnormalities of gait and mobility     Problem List Patient Active Problem List   Diagnosis Date Noted  . Generalized abdominal pain   . Rectum inflammation   . Internal hemorrhoids   . Diverticulosis of small intestine without hemorrhage   . Nausea and vomiting   . Lower abdominal pain 01/29/2019  . Diarrhea 01/29/2019  . GAD (generalized anxiety disorder) 01/29/2019  . Severe episode of recurrent major depressive  disorder, without psychotic features ([place]) 01/29/2019  . Panic disorder with agoraphobia 06/19/2018   Olivia Sullivan, SPT  Olivia Sullivan 08/17/2019, 4:42 PM  [place] MAIN [facility] SERVICES [place]  [place], [place], [address] Phone: [phone]   Fax:  [phone]  Name: Olivia Sullivan MRN: [id number] Date of Birth: 02/22/1996

## 2019-08-17 NOTE — Progress Notes (Signed)
Rx: Valium for vaginismus treatment in conjunction with physical therapy, see chart.    Diona Fanti, CNM [facility], [facility] 08/17/19 2:58 PM

## 2019-08-17 NOTE — Patient Instructions (Addendum)
         Hold for 30 seconds (5 deep breaths) and repeat 2-3 times on each side once a day (right side was more tight at session 08-17-19)  - only perform on sides that feel like a stretch.  Bridges    Exhale and pull the low back into the table before using the glutes to bridge your hips up off of the mat, just to the point that you can continue to keep the pelvis neutral. Inhale slightly at the top and then exhale to roll back down one spinal segment at a time, bring the upper then middle, then low back before finally letting the bottom relax down.  Do 2x10, 1 time per day.    Do 2 sets of 15 tilts per day. Breathe in when you tilt forward (A) and out when you tuck under (B).  Pelvic Rotation: Contract / Relax (Supine)    With knees bent over bolster, right knee crossed over other, press thighs together tightly without allowing movement. Hold __5__ seconds. Relax. Repeat __5__ times per set. Do __1-2__ sets per session. Do __1__ sessions per day.   Pelvic Rotation: Contract / Relax (Supine)  MET to Correct Right Anteriorly Rotated/Left Posteriorly Rotated Innominate   Begin laying on your back with your feet at 90 degrees. Put a dowel/broomstick  through your legs, behind your right knee and in front of your left knee. Stabilize the dowel on ether side with your hands.  Press down with the right leg and up with the left leg. Hold for 5 seconds  then slowly relax. Repeat 5 times.

## 2019-08-23 ENCOUNTER — Ambulatory Visit: Payer: BC Managed Care – PPO | Admitting: Urology

## 2019-08-27 LAB — MONITOR DRUG PROFILE 14(MW)
Amphetamine Scrn, Ur: NEGATIVE ng/mL
BARBITURATE SCREEN URINE: NEGATIVE ng/mL
BENZODIAZEPINE SCREEN, URINE: NEGATIVE ng/mL
Buprenorphine, Urine: NEGATIVE ng/mL
Cocaine (Metab) Scrn, Ur: NEGATIVE ng/mL
Creatinine(Crt), U: 324.2 mg/dL — ABNORMAL HIGH (ref 20.0–300.0)
Fentanyl, Urine: NEGATIVE pg/mL
Meperidine Screen, Urine: NEGATIVE ng/mL
Methadone Screen, Urine: NEGATIVE ng/mL
OXYCODONE+OXYMORPHONE UR QL SCN: NEGATIVE ng/mL
Opiate Scrn, Ur: NEGATIVE ng/mL
Ph of Urine: 5.6 (ref 4.5–8.9)
Phencyclidine Qn, Ur: NEGATIVE ng/mL
Propoxyphene Scrn, Ur: NEGATIVE ng/mL
SPECIFIC GRAVITY: 1.026
Tramadol Screen, Urine: NEGATIVE ng/mL

## 2019-08-27 LAB — CANNABINOID (GC/MS), URINE
Cannabinoid: POSITIVE — AB
Carboxy THC (GC/MS): 10 ng/mL

## 2019-08-30 ENCOUNTER — Other Ambulatory Visit: Payer: Self-pay

## 2019-08-30 ENCOUNTER — Ambulatory Visit: Payer: BC Managed Care – PPO | Admitting: Urology

## 2019-08-30 ENCOUNTER — Encounter: Payer: Self-pay | Admitting: Urology

## 2019-08-30 VITALS — BP 133/92 | HR 96 | Ht 65.0 in | Wt 159.0 lb

## 2019-08-30 DIAGNOSIS — N3946 Mixed incontinence: Secondary | ICD-10-CM

## 2019-08-30 DIAGNOSIS — R300 Dysuria: Secondary | ICD-10-CM

## 2019-08-30 LAB — MICROSCOPIC EXAMINATION: RBC, Urine: NONE SEEN /hpf (ref 0–2)

## 2019-08-30 LAB — URINALYSIS, COMPLETE
Bilirubin, UA: NEGATIVE
Glucose, UA: NEGATIVE
Ketones, UA: NEGATIVE
Leukocytes,UA: NEGATIVE
Nitrite, UA: NEGATIVE
Protein,UA: NEGATIVE
RBC, UA: NEGATIVE
Specific Gravity, UA: >1.03 — ABNORMAL HIGH (ref 1.005–1.030)
Urobilinogen, Ur: 0.2 mg/dL (ref 0.2–1.0)
pH, UA: 5.5 (ref 5.0–7.5)

## 2019-08-30 NOTE — Progress Notes (Signed)
08/30/2019 10:26 AM   Olivia Sullivan [birth year] [id number]  Referring provider: Doren Custard, FNP [address] [place],  [place] [address]  Chief Complaint  Patient presents with  . Dysuria    HPI: Was consulted to assess the patient's chronic history of dysuria and frequency.  She can void every 30 minutes and cannot hold it for 2 hours.  She has no nocturia.  She is a reasonable flow but does not necessarily feel empty  She has uncommon urge and stress incontinence but does not wear a pad.  She does not have bedwetting.  She describes negative urine cultures and having this problem for many years.  She even had issues as a child.  She has dyspareunia.  She does not get a lot of suprapubic discomfort.  The burning is near the urethra and vaginal opening when she urinates.  She has a history of irritable bowel syndrome  No history of kidney stones neurologic issues and has not had a hysterectomy  Modifying factors: There are no other modifying factors  Associated signs and symptoms: There are no other associated signs and symptoms Aggravating and relieving factors: There are no other aggravating or relieving factors Severity: Moderate Duration: Persistent   PMH: Past Medical History:  Diagnosis Date  . Anxiety   . Depression   . Migraine     Surgical History: Past Surgical History:  Procedure Laterality Date  . COLONOSCOPY WITH PROPOFOL N/A 03/11/2019   Procedure: COLONOSCOPY WITH PROPOFOL;  Surgeon: [name], MD;  Location: [facility] END[facility];  Service: Endoscopy;  Laterality: N/A;  . ESOPHAGOGASTRODUODENOSCOPY (EGD) WITH PROPOFOL N/A 03/11/2019   Procedure: ESOPHAGOGASTRODUODENOSCOPY (EGD) WITH PROPOFOL;  Surgeon: [name], MD;  Location: [facility] ENDOSCOPY;  Service: Endoscopy;  Laterality: N/A;  . NO PAST SURGERIES      Home Medications:  Allergies as of 08/30/2019   No Known Allergies     Medication List       Accurate as of  August 30, 2019 10:26 AM. If you have any questions, ask your nurse or doctor.        diazepam 5 MG tablet Commonly known as: VALIUM Insert 5-10 mg (1 to 2 tablets) vaginal nightly for one (1) month then insert vaginal three (3) times per week as needed for a total of 16 weeks. Refills require office visit.   norgestimate-ethinyl estradiol 0.25-35 MG-MCG tablet Commonly known as: ORTHO-CYCLEN Take 1 tablet by mouth daily.       Allergies: No Known Allergies  Family History: Family History  Problem Relation Age of Onset  . Diabetes Father   . Depression Father   . Anxiety disorder Father   . Depression Sister   . Anxiety disorder Sister   . Irritable bowel syndrome Sister   . Depression Brother   . Anxiety disorder Brother   . Irritable bowel syndrome Brother   . Diabetes Maternal Grandmother   . Endometriosis Maternal Grandmother   . Endometriosis Maternal Aunt   . Breast cancer Neg Hx   . Ovarian cancer Neg Hx   . Colon cancer Neg Hx     Social History:  reports that she has never smoked. She has never used smokeless tobacco. She reports current alcohol use. She reports that she does not use drugs.  ROS: UROLOGY Frequent Urination?: Yes Hard to postpone urination?: No Burning/pain with urination?: Yes Get up at night to urinate?: No Leakage of urine?: Yes Urine stream starts and stops?: No Trouble starting  stream?: No Do you have to strain to urinate?: No Blood in urine?: No Urinary tract infection?: No Sexually transmitted disease?: No Injury to kidneys or bladder?: No Painful intercourse?: Yes Weak stream?: No Currently pregnant?: No Vaginal bleeding?: No Last menstrual period?: N  Gastrointestinal Nausea?: No Vomiting?: No Indigestion/heartburn?: No Diarrhea?: Yes Constipation?: Yes  Constitutional Fever: No Night sweats?: No Weight loss?: No Fatigue?: Yes  Skin Skin rash/lesions?: No Itching?: No  Eyes Blurred vision?: No Double  vision?: No  Ears/Nose/Throat Sore throat?: No Sinus problems?: No  Hematologic/Lymphatic Swollen glands?: No Easy bruising?: No  Cardiovascular Leg swelling?: No Chest pain?: No  Respiratory Cough?: No Shortness of breath?: No  Endocrine Excessive thirst?: No  Musculoskeletal Back pain?: No Joint pain?: No  Neurological Headaches?: No Dizziness?: No  Psychologic Depression?: No Anxiety?: Yes  Physical Exam: BP (!) 133/92   Pulse 96   Ht 5\' 5"  (1.651 m)   Wt 159 lb (72.1 kg)   LMP 08/23/2019   BMI 26.46 kg/m   Constitutional:  Alert and oriented, No acute distress. HEENT: [place] AT, moist mucus membranes.  Trachea midline, no masses. Cardiovascular: No clubbing, cyanosis, or edema. Respiratory: Normal respiratory effort, no increased work of breathing. GI: Abdomen is soft, nontender, nondistended, no abdominal masses GU: On pelvic examination the patient was a bit nervous.  She had excellent bladder support.  No diverticulum.  Levator muscles a bit tender as was her bladder. Skin: No rashes, bruises or suspicious lesions. Lymph: No cervical or inguinal adenopathy. Neurologic: Grossly intact, no focal deficits, moving all 4 extremities. Psychiatric: Normal mood and affect.  Laboratory Data: Lab Results  Component Value Date   WBC 9.0 05/17/2019   HGB 13.4 05/17/2019   HCT 40.5 05/17/2019   MCV 84.2 05/17/2019   PLT 384 05/17/2019    Lab Results  Component Value Date   CREATININE 0.74 05/17/2019    No results found for: PSA  No results found for: TESTOSTERONE  Lab Results  Component Value Date   HGBA1C 4.9 05/19/2019    Urinalysis    Component Value Date/Time   BILIRUBINUR Negative 07/19/2019 1420   PROTEINUR Negative 07/19/2019 1420   UROBILINOGEN 0.2 07/19/2019 1420   NITRITE Negative 07/19/2019 1420   LEUKOCYTESUR Small (1+) (A) 07/19/2019 1420    Pertinent Imaging:   Assessment & Plan: Patient has significant frequency chronic  burning and mild mixed incontinence.  She may have interstitial cystitis.  The role of urodynamics discussed.  She may need a hydrodistention in the future.  She was quite tender but also nervous that may have an element of vulvodynia.  My index suspicion is moderately high that she has interstitial cystitis.  I mentioned a bladder hydrodistention briefly today as well.  She understands the concept of an inflamed bladder.  Next visit I will also ask her what symptom or sensation is making her void every 30 minutes  There are no diagnoses linked to this encounter.  No follow-ups on file.  Reece Packer, MD  [facility] [address] [place], [place] [address] [phone]

## 2019-09-06 ENCOUNTER — Other Ambulatory Visit: Payer: Self-pay | Admitting: Family Medicine

## 2019-09-06 DIAGNOSIS — N3946 Mixed incontinence: Secondary | ICD-10-CM

## 2019-09-07 ENCOUNTER — Ambulatory Visit: Payer: BC Managed Care – PPO | Attending: Certified Nurse Midwife

## 2019-09-07 ENCOUNTER — Other Ambulatory Visit: Payer: Self-pay

## 2019-09-07 DIAGNOSIS — N3946 Mixed incontinence: Secondary | ICD-10-CM | POA: Diagnosis not present

## 2019-09-07 DIAGNOSIS — M62838 Other muscle spasm: Secondary | ICD-10-CM | POA: Insufficient documentation

## 2019-09-07 DIAGNOSIS — R2689 Other abnormalities of gait and mobility: Secondary | ICD-10-CM | POA: Insufficient documentation

## 2019-09-07 DIAGNOSIS — R293 Abnormal posture: Secondary | ICD-10-CM | POA: Insufficient documentation

## 2019-09-07 NOTE — Therapy (Signed)
[facility] MAIN [facility] SERVICES [address] [place], [place], [address] Phone: [phone]   Fax:  [phone]  Physical Therapy Treatment  The patient has been informed of current processes in place at Outpatient Rehab to protect patients from Covid-19 exposure including social distancing, schedule modifications, and new cleaning procedures. After discussing their particular risk with a therapist based on the patient's personal risk factors, the patient has decided to proceed with in-person therapy.   Patient Details  Name: Olivia Sullivan MRN: [id number] Date of Birth: [birth year] Referring Provider (PT): Rose Phi CNM    Encounter Date: 09/07/2019  PT End of Session - 09/07/19 1706    Visit Number  3    Number of Visits  10    Date for PT Re-Evaluation  10/19/19    Authorization - Visit Number  3    Authorization - Number of Visits  10    PT Start Time  1540    PT Stop Time  1640    PT Time Calculation (min)  60 min    Activity Tolerance  Patient tolerated treatment well;No increased pain    Behavior During Therapy  WFL for tasks assessed/performed       Past Medical History:  Diagnosis Date  . Anxiety   . Depression   . Migraine     Past Surgical History:  Procedure Laterality Date  . COLONOSCOPY WITH PROPOFOL N/A 03/11/2019   Procedure: COLONOSCOPY WITH PROPOFOL;  Surgeon: [name], MD;  Location: [facility] END[facility];  Service: Endoscopy;  Laterality: N/A;  . ESOPHAGOGASTRODUODENOSCOPY (EGD) WITH PROPOFOL N/A 03/11/2019   Procedure: ESOPHAGOGASTRODUODENOSCOPY (EGD) WITH PROPOFOL;  Surgeon: [name], MD;  Location: [facility] END[facility];  Service: Endoscopy;  Laterality: N/A;  . NO PAST SURGERIES      There were no vitals filed for this visit.    Pelvic Floor Physical Therapy Treatment Note  SCREENING  Changes in medications, allergies, or medical history?: No    SUBJECTIVE  Patient reports: Has strong  history of hip dysplasia and would like to get imaging to rule it out. She had an appointment with urology yesterday and they want to do a urodynamics study.  Precautions:  N/A   Pain update:  Location of pain 1 : LBP (chronic)  Current pain:  3/10  Max pain:  5/10 Least pain:  0/10 - only if sitting down for long enough.  Nature of pain: sharp pain at the base of her spine. Both sides even  Pain at vulva: Burning sensation: 3/10 on NPS  Max: 8/10 on NPS  Min: 1-2/10 - something is always present  Ice assists. Few times a week. After trying intercourse.   Location of pain 2 B hip (R >L)  Current pain: 3/10 on NPS  Max pain: 5/10 on NPS (when up and walking) Least: 2/10 on NPS (dull)  With walking the R hip with pop after about 20 minutes - sharp pain   --Pain 2/10 following treatment today    Patient Goals: To be pain free. The most aggravating is with urination.    OBJECTIVE  Changes in: Posture/Observations:  ASIS and PSIS level in standing.   Range of Motion/Flexibilty:  N/A   Strength/MMT: N/A  LE MMT:  Pelvic floor:  N/A  Abdominal:  L Lower quadrant tension (not assessed this session)  Palpation: TTP to B adductor magnus, gracilis, and Psoas with allodynia and fascial restriction in R>L only able to tolerate very light superficial touch.  -  following treatment Pt. Able to tolerate moderate to heavy pressure B   Gait Analysis: N/A   INTERVENTIONS THIS SESSION: Manual Therapy: Performed light to progressively moderate++ pressure STM and fascial mobilization through B adductors when Pt. Unable to tolerate TP release to B adductors to decrease tension acting on the pelvis and to down-regulate the nervous system in this highly sensitized area.   Therex: Educated on and practiced adductor stretch in supine to maintain improved myofascial length, decrease tension acting on the PFM and allow for decreased pain and spasm.  Self Care: Educated on paying  attention to what she is eating and drinking and writing it down along with her pain levels to help determine if there is a pattern consistent with IC. Educated on the difference between acute and persistent pain and how we have to treat them differently, given further educational video by email to watch at home. Discussed down-regulating the nervous system and teaching the body about "safe touch". Educated on how she can use a cotton ball at home to desensitize the area around the vulva.   Total time: 60 minutes                            PT Short Term Goals - 08/11/19 1052      PT SHORT TERM GOAL #1   Title  Patient will demonstrate a coordinated contraction, relaxation, and bulge of the pelvic floor muscles to demonstrate functional recruitment and motion and allow for further strengthening.    Baseline  paradoxical TA activation with breathing    Time  5    Period  Weeks    Status  New    Target Date  09/14/19      PT SHORT TERM GOAL #2   Title  Patient will demonstrate appropriate body mechanics with ambulation to allow for decreased stress on the pelvic floor and low back.    Baseline  antalgic, slow gait    Time  5    Period  Weeks    Status  New    Target Date  09/14/19      PT SHORT TERM GOAL #3   Title  Patient will report a reduction in pain to no greater than 5/10 over the prior week to demonstrate symptom improvement.    Baseline  7-8/10 on NPS at LBP, hips and vulva    Time  5    Period  Weeks    Status  New    Target Date  09/14/19        PT Long Term Goals - 08/11/19 1056      PT LONG TERM GOAL #1   Title  Patient will report no episodes of mixed UI over the course of the prior two weeks to demonstrate improved functional ability.    Baseline  mixed UI- urgency and stress leakage every day    Time  10    Period  Weeks    Status  New    Target Date  10/19/19      PT LONG TERM GOAL #2   Title  Patient will demonstrate improvement on  Female Sexual Function Index (FSFI) from 23 to 46 demonstrating improved QOL with sexual functions.    Baseline  23/100- indicating high risk for sexual dysfunction    Time  10    Period  Weeks    Status  New    Target Date  10/19/19  PT LONG TERM GOAL #3   Title  Patient will score less than or equal to 20% on the Female NIH-CPSI to demonstrate a reduction in pain, urinary symptoms, and an improved quality of life.    Baseline  34/44 (78%)    Time  10    Period  Weeks    Status  New    Target Date  10/19/19      PT LONG TERM GOAL #4   Title  Patient will describe pain no greater than 2/10 during ADL's, work, and following intercourse to demonstrate improved functional ability.    Baseline  7-8/10 on NPS at LBP, hips and vulva increased by above activities.    Time  10    Period  Weeks    Status  New    Target Date  10/19/19            Plan - 09/07/19 1706    Clinical Impression Statement  Pt. Responded well to all interventions today, demonstrating improved tolerance to touch at the medial thigh, decreased fascial restriction and decreased pain from 3/10 to 2/10 globally as well as understanding and correct performance of all education and exercises provided today. They will continue to benefit from skilled physical therapy to work toward remaining goals and maximize function as well as decrease likelihood of symptom increase or recurrence.     Personal Factors and Comorbidities  Behavior Pattern;Comorbidity 2;Past/Current Experience;Time since onset of injury/illness/exacerbation    Comorbidities  anxiety/depression/generalized personality/disorder; headaches/migraines.    Examination-Activity Limitations  Sit;Stand;Toileting;Continence;Hygiene/Grooming    Examination-Participation Restrictions  Interpersonal Relationship;Community Activity    Stability/Clinical Decision Making  Unstable/Unpredictable    Clinical Decision Making  High    Rehab Potential  Good    PT  Frequency  2x / week    PT Duration  --   10 weeks   PT Treatment/Interventions  ADLs/Self Care Home Management;Biofeedback;Electrical Stimulation;Moist Heat;Traction;Ultrasound;Stair training;Functional mobility training;Therapeutic activities;Gait training;Balance training;Therapeutic exercise;Neuromuscular re-education;Patient/family education;Manual techniques;Dry needling;Energy conservation;Spinal Manipulations;Joint Manipulations    PT Next Visit Plan  address pain/ TTP; improve pelvic alignment; determine LLD when pelvic alignment is improved; when appropriate address external PFM; trial TENS for desensization.    PT Home Exercise Plan  diaphragmatic breathing in seated and hook-lying; side stretch (R>L); seated pelvic tilts; sleeping positions; R anterior innominate correction; bridges    Consulted and Agree with Plan of Care  Patient       Patient will benefit from skilled therapeutic intervention in order to improve the following deficits and impairments:  Abnormal gait, Decreased activity tolerance, Decreased balance, Decreased coordination, Decreased endurance, Decreased mobility, Decreased range of motion, Decreased strength, Increased muscle spasms, Increased fascial restricitons, Postural dysfunction, Improper body mechanics, Pain  Visit Diagnosis: Other muscle spasm  Mixed incontinence  Abnormal posture  Other abnormalities of gait and mobility     Problem List Patient Active Problem List   Diagnosis Date Noted  . Generalized abdominal pain   . Rectum inflammation   . Internal hemorrhoids   . Diverticulosis of small intestine without hemorrhage   . Nausea and vomiting   . Lower abdominal pain 01/29/2019  . Diarrhea 01/29/2019  . GAD (generalized anxiety disorder) 01/29/2019  . Severe episode of recurrent major depressive disorder, without psychotic features (HCC) 01/29/2019  . Panic disorder with agoraphobia 06/19/2018   Cleophus Molt DPT, ATC Cleophus Molt 09/07/2019, 5:09 PM  [facility] MAIN [facility] SERVICES [address] [place], [place], [address]  Phone: [phone]   Fax:  [phone]  Name: Olivia Sullivan MRN: [id number] Date of Birth: 02-Sep-1996

## 2019-09-07 NOTE — Patient Instructions (Signed)
   Bring feet together and let the knees fall out to the sides. Hold for 5 deep breaths, rest then repeat 2 more times.   

## 2019-09-13 ENCOUNTER — Ambulatory Visit: Payer: BC Managed Care – PPO | Admitting: Family Medicine

## 2019-09-14 ENCOUNTER — Ambulatory Visit: Payer: BC Managed Care – PPO

## 2019-09-14 ENCOUNTER — Other Ambulatory Visit: Payer: Self-pay

## 2019-09-14 DIAGNOSIS — N3946 Mixed incontinence: Secondary | ICD-10-CM

## 2019-09-14 DIAGNOSIS — R293 Abnormal posture: Secondary | ICD-10-CM

## 2019-09-14 DIAGNOSIS — R2689 Other abnormalities of gait and mobility: Secondary | ICD-10-CM | POA: Diagnosis not present

## 2019-09-14 DIAGNOSIS — M62838 Other muscle spasm: Secondary | ICD-10-CM | POA: Diagnosis not present

## 2019-09-14 NOTE — Therapy (Signed)
[place] MAIN [facility] SERVICES [address] [place], [place], [address] Phone: [phone]   Fax:  [phone]  Physical Therapy Treatment  The patient has been informed of current processes in place at Outpatient Rehab to protect patients from Covid-19 exposure including social distancing, schedule modifications, and new cleaning procedures. After discussing their particular risk with a therapist based on the patient's personal risk factors, the patient has decided to proceed with in-person therapy.   Patient Details  Name: Olivia Sullivan MRN: [id number] Date of Birth: May 19, 1996 Referring Provider (PT): Bluford Main CNM    Encounter Date: 09/14/2019  PT End of Session - 09/15/19 6962    Visit Number  4    Number of Visits  10    Date for PT Re-Evaluation  10/19/19    Authorization - Visit Number  4    Authorization - Number of Visits  10    PT Start Time  9528    PT Stop Time  1630    PT Time Calculation (min)  60 min    Activity Tolerance  Patient tolerated treatment well;No increased pain    Behavior During Therapy  WFL for tasks assessed/performed       Past Medical History:  Diagnosis Date  . Anxiety   . Depression   . Migraine     Past Surgical History:  Procedure Laterality Date  . COLONOSCOPY WITH PROPOFOL N/A 03/11/2019   Procedure: COLONOSCOPY WITH PROPOFOL;  Surgeon: Virgel Manifold, MD;  Location: [facility] ENDOSCOPY;  Service: Endoscopy;  Laterality: N/A;  . ESOPHAGOGASTRODUODENOSCOPY (EGD) WITH PROPOFOL N/A 03/11/2019   Procedure: ESOPHAGOGASTRODUODENOSCOPY (EGD) WITH PROPOFOL;  Surgeon: Virgel Manifold, MD;  Location: [facility] ENDOSCOPY;  Service: Endoscopy;  Laterality: N/A;  . NO PAST SURGERIES      There were no vitals filed for this visit.    Pelvic Floor Physical Therapy Treatment Note  SCREENING  Changes in medications, allergies, or medical history?: No    SUBJECTIVE  Patient reports: Felt pretty  good after last visit but on Friday her pain came back and she has needed a heating pad to go to sleep Friday through Sunday.  Precautions:  N/A   Pain update:  Location of pain 1 : LBP (chronic)  Current pain:  4/10  Max pain:  7/10 Least pain:  0/10 - only if sitting down for long enough.  Nature of pain: sharp pain at the base of her spine. Both sides even  Pain at vulva: Burning sensation: 2/10 on NPS  Max: 7/10 on NPS  Min: 1-2/10 - something is always present  Ice assists. Few times a week. After trying intercourse.   Location of pain 2 B hip (R >L)  Current pain: 4/10 on NPS  Max pain: 7/10 on NPS (when up and walking) Least: 2/10 on NPS (dull)  With walking the R hip with pop after about 20 minutes - sharp pain   --Pain 2/10 following treatment today    Patient Goals: To be pain free. The most aggravating is with urination.    OBJECTIVE  Changes in: Posture/Observations:  R anterior rotation and L up-slip noted today.   -~ 50% improvement in alignment following treatment, L innominate continues to appear higher in supine.  Range of Motion/Flexibilty:  N/A   Strength/MMT: N/A  difficulty recruiting R>L glutes for bridging pre-treatment, improved following.   Pelvic floor:  N/A  Abdominal:    Palpation: TTP to R OI externally, QL, Psoas, and  Glute med.  Gait Analysis: N/A   INTERVENTIONS THIS SESSION: Manual Therapy: Performed MET correction x2 for R anterior innominate rotation, MWM to R innominate into posteriorly rotation, grade 3-4 PA mobs to R sacral border, L up-slip correction, TP release to R OI externally, QL, Psoas, and Glute med to decrease spasm and pain and allow for improved balance of musculature for improved function and decreased symptoms.  Theract: Educated on and trailed TENS at B PSIS and medial to ASIS with channels separated ipsilaterally and sensory level stim to interrupt pain spasm cycle and down regulate neural activity to  decrease frequency and urgency as well as burning sensation at the vulva.   Total time: 60 minutes                             PT Short Term Goals - 08/11/19 1052      PT SHORT TERM GOAL #1   Title  Patient will demonstrate a coordinated contraction, relaxation, and bulge of the pelvic floor muscles to demonstrate functional recruitment and motion and allow for further strengthening.    Baseline  paradoxical TA activation with breathing    Time  5    Period  Weeks    Status  New    Target Date  09/14/19      PT SHORT TERM GOAL #2   Title  Patient will demonstrate appropriate body mechanics with ambulation to allow for decreased stress on the pelvic floor and low back.    Baseline  antalgic, slow gait    Time  5    Period  Weeks    Status  New    Target Date  09/14/19      PT SHORT TERM GOAL #3   Title  Patient will report a reduction in pain to no greater than 5/10 over the prior week to demonstrate symptom improvement.    Baseline  7-8/10 on NPS at LBP, hips and vulva    Time  5    Period  Weeks    Status  New    Target Date  09/14/19        PT Long Term Goals - 08/11/19 1056      PT LONG TERM GOAL #1   Title  Patient will report no episodes of mixed UI over the course of the prior two weeks to demonstrate improved functional ability.    Baseline  mixed UI- urgency and stress leakage every day    Time  10    Period  Weeks    Status  New    Target Date  10/19/19      PT LONG TERM GOAL #2   Title  Patient will demonstrate improvement on Female Sexual Function Index (FSFI) from 23 to 46 demonstrating improved QOL with sexual functions.    Baseline  23/100- indicating high risk for sexual dysfunction    Time  10    Period  Weeks    Status  New    Target Date  10/19/19      PT LONG TERM GOAL #3   Title  Patient will score less than or equal to 20% on the Female NIH-CPSI to demonstrate a reduction in pain, urinary symptoms, and an improved  quality of life.    Baseline  34/44 (78%)    Time  10    Period  Weeks    Status  New    Target Date  10/19/19      PT LONG TERM GOAL #4   Title  Patient will describe pain no greater than 2/10 during ADL's, work, and following intercourse to demonstrate improved functional ability.    Baseline  7-8/10 on NPS at LBP, hips and vulva increased by above activities.    Time  10    Period  Weeks    Status  New    Target Date  10/19/19            Plan - 09/15/19 0826    Clinical Impression Statement  Pt. Responded well to all interventions today, demonstrating improved pelvic alignment and pain reduction from 4/10 to 2/10 as well as understanding and correct performance of all education and exercises provided today. They will continue to benefit from skilled physical therapy to work toward remaining goals and maximize function as well as decrease likelihood of symptom increase or recurrence.     Personal Factors and Comorbidities  Behavior Pattern;Comorbidity 2;Past/Current Experience;Time since onset of injury/illness/exacerbation    Comorbidities  anxiety/depression/generalized personality/disorder; headaches/migraines.    Examination-Activity Limitations  Sit;Stand;Toileting;Continence;Hygiene/Grooming    Examination-Participation Restrictions  Interpersonal Relationship;Community Activity    Stability/Clinical Decision Making  Unstable/Unpredictable    Rehab Potential  Good    PT Frequency  2x / week    PT Duration  --   10 weeks   PT Treatment/Interventions  ADLs/Self Care Home Management;Biofeedback;Electrical Stimulation;Moist Heat;Traction;Ultrasound;Stair training;Functional mobility training;Therapeutic activities;Gait training;Balance training;Therapeutic exercise;Neuromuscular re-education;Patient/family education;Manual techniques;Dry needling;Energy conservation;Spinal Manipulations;Joint Manipulations    PT Next Visit Plan  address pain/ TTP; improve pelvic alignment;  re-check for LLD when pelvic alignment is improved; when appropriate address external PFM; trial TENS for desensization.    PT Home Exercise Plan  diaphragmatic breathing in seated and hook-lying; side stretch (R>L); seated pelvic tilts; sleeping positions; R anterior innominate correction; bridges    Consulted and Agree with Plan of Care  Patient       Patient will benefit from skilled therapeutic intervention in order to improve the following deficits and impairments:  Abnormal gait, Decreased activity tolerance, Decreased balance, Decreased coordination, Decreased endurance, Decreased mobility, Decreased range of motion, Decreased strength, Increased muscle spasms, Increased fascial restricitons, Postural dysfunction, Improper body mechanics, Pain  Visit Diagnosis: Other muscle spasm  Mixed incontinence  Abnormal posture  Other abnormalities of gait and mobility     Problem List Patient Active Problem List   Diagnosis Date Noted  . Generalized abdominal pain   . Rectum inflammation   . Internal hemorrhoids   . Diverticulosis of small intestine without hemorrhage   . Nausea and vomiting   . Lower abdominal pain 01/29/2019  . Diarrhea 01/29/2019  . GAD (generalized anxiety disorder) 01/29/2019  . Severe episode of recurrent major depressive disorder, without psychotic features ([place]) 01/29/2019  . Panic disorder with agoraphobia 06/19/2018   Willa Rough DPT, ATC Willa Rough 09/15/2019, 8:34 AM  [place] MAIN [facility] SERVICES [address] [place], [place], [address] Phone: [phone]   Fax:  [phone]  Name: Janele Lague MRN: [id number] Date of Birth: 04/20/1996

## 2019-09-14 NOTE — Patient Instructions (Signed)
Wear for at least 30 min. Per day, you can wear it more if you want but note how often you are wearing it in your pain diary.  Bring in pain diary at next visit.  Try urinating with tens unit on to see if you are able to empty more fully.

## 2019-09-17 DIAGNOSIS — R3915 Urgency of urination: Secondary | ICD-10-CM | POA: Diagnosis not present

## 2019-09-17 DIAGNOSIS — R350 Frequency of micturition: Secondary | ICD-10-CM | POA: Diagnosis not present

## 2019-09-20 ENCOUNTER — Other Ambulatory Visit: Payer: Self-pay | Admitting: Urology

## 2019-09-21 ENCOUNTER — Other Ambulatory Visit: Payer: Self-pay

## 2019-09-21 ENCOUNTER — Ambulatory Visit: Payer: BC Managed Care – PPO

## 2019-09-21 DIAGNOSIS — N3946 Mixed incontinence: Secondary | ICD-10-CM | POA: Diagnosis not present

## 2019-09-21 DIAGNOSIS — R2689 Other abnormalities of gait and mobility: Secondary | ICD-10-CM

## 2019-09-21 DIAGNOSIS — R293 Abnormal posture: Secondary | ICD-10-CM

## 2019-09-21 DIAGNOSIS — M62838 Other muscle spasm: Secondary | ICD-10-CM | POA: Diagnosis not present

## 2019-09-21 NOTE — Therapy (Signed)
[place] [facility] MAIN [facility] SERVICES [address] [place], [place], [address] Phone: [phone]   Fax:  [phone]  Physical Therapy Treatment  The patient has been informed of current processes in place at Outpatient Rehab to protect patients from Covid-19 exposure including social distancing, schedule modifications, and new cleaning procedures. After discussing their particular risk with a therapist based on the patient's personal risk factors, the patient has decided to proceed with in-person therapy.   Patient Details  Name: Olivia Sullivan MRN: [id number] Date of Birth: 1996/04/18 Referring Provider (PT): Rose Phi CNM    Encounter Date: 09/21/2019  PT End of Session - 09/21/19 1641    Visit Number  5    Number of Visits  10    Date for PT Re-Evaluation  10/19/19    Authorization - Visit Number  4    Authorization - Number of Visits  10    PT Start Time  1540    PT Stop Time  1645    PT Time Calculation (min)  65 min    Activity Tolerance  Patient tolerated treatment well;No increased pain    Behavior During Therapy  WFL for tasks assessed/performed       Past Medical History:  Diagnosis Date  . Anxiety   . Depression   . Migraine     Past Surgical History:  Procedure Laterality Date  . COLONOSCOPY WITH PROPOFOL N/A 03/11/2019   Procedure: COLONOSCOPY WITH PROPOFOL;  Surgeon: [name], MD;  Location: [facility] ENDOSCOPY;  Service: Endoscopy;  Laterality: N/A;  . ESOPHAGOGASTRODUODENOSCOPY (EGD) WITH PROPOFOL N/A 03/11/2019   Procedure: ESOPHAGOGASTRODUODENOSCOPY (EGD) WITH PROPOFOL;  Surgeon: [name], MD;  Location: [facility] ENDOSCOPY;  Service: Endoscopy;  Laterality: N/A;  . NO PAST SURGERIES      There were no vitals filed for this visit.    Pelvic Floor Physical Therapy Treatment Note  SCREENING  Changes in medications, allergies, or medical history?: No    SUBJECTIVE  Patient reports: Pain was  less when she had TENS unit on but did not notice that it helped with pain during urination or when TENS unit off. She had the urodynamics study done yesterday and it was really terrible when they placed the catheter, they think she has IC. She has been on a lot of antibiotics over the years.  Precautions:  N/A   Pain update:  Location of pain 1 : LBP (chronic)  Current pain:  3-4/10  Max pain:  6-7/10 Least pain:  0/10 - only if sitting down for long enough.  Nature of pain: sharp pain at the base of her spine. Both sides even   Pain at vulva: Burning sensation: 2/10 on NPS  Max: 7/10 on NPS  Min: 1-2/10 - something is always present  Ice assists. Few times a week. After trying intercourse.   Location of pain 2 B hip (R >L)  Current pain: 4/10 on NPS  Max pain: 7/10 on NPS (when up and walking) Least: 2/10 on NPS (dull)  With walking the R hip with pop after about 20 minutes - sharp pain   **no pain following session   Patient Goals: To be pain free. The most aggravating is with urination.    OBJECTIVE  Changes in: Posture/Observations:  L up-slip noted today.   Pelvis appears level following treatment.  Range of Motion/Flexibilty:  Decreased mobility/increased pain with motion through the thoracolumbar spine.   Strength/MMT: N/A  difficulty recruiting R>L glutes for  bridging pre-treatment, improved following.   Pelvic floor:  N/A  Abdominal:    Palpation: TTP to R OI externally, QL, Psoas, and Glute med.  Gait Analysis: N/A   INTERVENTIONS THIS SESSION: Manual Therapy: Performed grade 2-4 PA mobs in side-lying through T-L junction followed by QL distraction, TP release to L QL, and up-slip correction for L QL. Performed TP release to R cervical extensors and grade 1-2 R to L mobs to decrease spasm and pain and improve cervical mobility to begin decreasing excitability of the nervous system.    Theract: Reviewed success of TENS and determined to try for  one more week to determine effect on decrease frequency and urgency as well as burning sensation at the vulva. Educated on probiotics and how it may help her restore a healthier GI/urinary tract/vaginal flora to decrease sensitivity and pain.  Therex: Attempted bow-and-arrow rotations but this flared R shoulder pain from use of neck musculature with rotation motion so switched to hook-lying lumbar rotations with exhale to improve spinal mobility and decrease pain.    Total time: 60 minutes                              PT Short Term Goals - 08/11/19 1052      PT SHORT TERM GOAL #1   Title  Patient will demonstrate a coordinated contraction, relaxation, and bulge of the pelvic floor muscles to demonstrate functional recruitment and motion and allow for further strengthening.    Baseline  paradoxical TA activation with breathing    Time  5    Period  Weeks    Status  New    Target Date  09/14/19      PT SHORT TERM GOAL #2   Title  Patient will demonstrate appropriate body mechanics with ambulation to allow for decreased stress on the pelvic floor and low back.    Baseline  antalgic, slow gait    Time  5    Period  Weeks    Status  New    Target Date  09/14/19      PT SHORT TERM GOAL #3   Title  Patient will report a reduction in pain to no greater than 5/10 over the prior week to demonstrate symptom improvement.    Baseline  7-8/10 on NPS at LBP, hips and vulva    Time  5    Period  Weeks    Status  New    Target Date  09/14/19        PT Long Term Goals - 08/11/19 1056      PT LONG TERM GOAL #1   Title  Patient will report no episodes of mixed UI over the course of the prior two weeks to demonstrate improved functional ability.    Baseline  mixed UI- urgency and stress leakage every day    Time  10    Period  Weeks    Status  New    Target Date  10/19/19      PT LONG TERM GOAL #2   Title  Patient will demonstrate improvement on Female Sexual  Function Index (FSFI) from 23 to 46 demonstrating improved QOL with sexual functions.    Baseline  23/100- indicating high risk for sexual dysfunction    Time  10    Period  Weeks    Status  New    Target Date  10/19/19  PT LONG TERM GOAL #3   Title  Patient will score less than or equal to 20% on the Female NIH-CPSI to demonstrate a reduction in pain, urinary symptoms, and an improved quality of life.    Baseline  34/44 (78%)    Time  10    Period  Weeks    Status  New    Target Date  10/19/19      PT LONG TERM GOAL #4   Title  Patient will describe pain no greater than 2/10 during ADL's, work, and following intercourse to demonstrate improved functional ability.    Baseline  7-8/10 on NPS at LBP, hips and vulva increased by above activities.    Time  10    Period  Weeks    Status  New    Target Date  10/19/19            Plan - 09/21/19 1641   Clinical Impression Statement:                                            Pt. Responded well to all interventions today,                                                                                                           demonstrating improved pain and spasm, pelvic and                                                                                               cervical alignment, as well as understanding and                                                                                                     correct performance of all education and exercises  provided today. They will continue to benefit from                                                                                                     skilled physical therapy to work toward remaining                                                                                                     goals and maximize function as well as decrease                                                                                               likelihood of symptom increase or recurrence.    Personal Factors and Comorbidities  Behavior Pattern;Comorbidity 2;Past/Current Experience;Time since onset of injury/illness/exacerbation    Comorbidities  anxiety/depression/generalized personality/disorder; headaches/migraines.    Examination-Activity Limitations  Sit;Stand;Toileting;Continence;Hygiene/Grooming    Examination-Participation Restrictions  Interpersonal Relationship;Community Activity    Stability/Clinical Decision Making  Unstable/Unpredictable    Rehab Potential  Good    PT Frequency  2x / week    PT Duration  --   10 weeks   PT Treatment/Interventions  ADLs/Self Care Home Management;Biofeedback;Electrical Stimulation;Moist Heat;Traction;Ultrasound;Stair training;Functional mobility training;Therapeutic activities;Gait training;Balance training;Therapeutic exercise;Neuromuscular re-education;Patient/family education;Manual techniques;Dry needling;Energy conservation;Spinal Manipulations;Joint Manipulations    PT Next Visit Plan  LOOK AT NECK CLOSER/R sid of cervical spine. address pain/ TTP; improve pelvic alignment; re-check for LLD when pelvic alignment is improved; when appropriate address external PFM; trial TENS for desensization.    PT Home Exercise Plan  diaphragmatic breathing in seated and hook-lying; side stretch (R>L); seated pelvic tilts; sleeping positions; R anterior innominate correction; bridges    Consulted and Agree with Plan of Care  Patient       Patient will benefit from skilled therapeutic intervention in order to improve the following deficits and impairments:  Abnormal gait, Decreased activity tolerance, Decreased balance, Decreased coordination, Decreased endurance, Decreased mobility, Decreased range of motion, Decreased strength, Increased muscle spasms, Increased fascial restricitons, Postural dysfunction, Improper body mechanics, Pain  Visit  Diagnosis: Other muscle spasm  Mixed incontinence  Abnormal posture  Other abnormalities of gait and mobility     Problem List Patient Active Problem List   Diagnosis Date Noted  . Generalized abdominal pain   . Rectum inflammation   .  Internal hemorrhoids   . Diverticulosis of small intestine without hemorrhage   . Nausea and vomiting   . Lower abdominal pain 01/29/2019  . Diarrhea 01/29/2019  . GAD (generalized anxiety disorder) 01/29/2019  . Severe episode of recurrent major depressive disorder, without psychotic features ([place]) 01/29/2019  . Panic disorder with agoraphobia 06/19/2018   Willa Rough DPT, ATC Willa Rough 09/22/2019, 6:27 PM  [place] MAIN [facility] SERVICES [address] [place], [place], [address] Phone: [phone]   Fax:  [phone]  Name: Olivia Sullivan MRN: [id number] Date of Birth: 01-11-1996

## 2019-09-21 NOTE — Patient Instructions (Addendum)
 ~ $  16 for a month supply   Inhale at the middle, exhale as you rotate one way to the point of slight discomfort, inhale slightly and exhale to come back up to the middle. Do 2x10 (left and right together count as 1)   When seated, you want to maintain pelvic neutral with the shoulders gently down and back and ears in line with your shoulders. A lumbar roll such as the one below or a home-made towel-roll can be used for this purpose. Even Olympic athletes can only maintain proper seated posture for about 10 minutes without support!    Pictured: The Original [employer] Lumbar Roll

## 2019-09-23 ENCOUNTER — Ambulatory Visit (INDEPENDENT_AMBULATORY_CARE_PROVIDER_SITE_OTHER): Payer: BC Managed Care – PPO | Admitting: Family Medicine

## 2019-09-23 ENCOUNTER — Encounter: Payer: Self-pay | Admitting: Family Medicine

## 2019-09-23 ENCOUNTER — Telehealth: Payer: Self-pay | Admitting: Family Medicine

## 2019-09-23 ENCOUNTER — Other Ambulatory Visit: Payer: Self-pay

## 2019-09-23 VITALS — BP 118/68 | HR 96 | Temp 97.3°F | Resp 14 | Ht 65.0 in | Wt 157.9 lb

## 2019-09-23 DIAGNOSIS — F332 Major depressive disorder, recurrent severe without psychotic features: Secondary | ICD-10-CM

## 2019-09-23 DIAGNOSIS — G8929 Other chronic pain: Secondary | ICD-10-CM

## 2019-09-23 DIAGNOSIS — M25551 Pain in right hip: Secondary | ICD-10-CM

## 2019-09-23 DIAGNOSIS — R591 Generalized enlarged lymph nodes: Secondary | ICD-10-CM

## 2019-09-23 NOTE — Progress Notes (Signed)
Name: Olivia Sullivan   MRN: [id number]    DOB: 1996/03/12   Date:09/23/2019       Progress Note  Subjective  Chief Complaint  Chief Complaint  Patient presents with  . Cyst    behind left ear    HPI  Pt presents with concern for Cyst behind the left ear - has been present since childhood, however a few weeks ago she noticed it starting to become larger.  Has never had prior work-up on the lesion.  Denies fevers/chills, recent illness, fatigue, cough/cold, nasal congestion, ear pain. Lesion is non-tender, no erythema, no exudate.   Depression: PHQ-9 score is elevated; she has not been returning to her psychiatrist.  She denies SI/HI.  She is not taking medications aside from valium PRN at this time. Does not want referral today, would like to pursue new psychiatrist and counselor on her own at this point.  Depression screen [facility] 2/9 09/23/2019 05/17/2019 02/16/2019 01/29/2019 01/29/2019  Decreased Interest 1 2 2 2  0  Down, Depressed, Hopeless 1 1 2 2  0  PHQ - 2 Score 2 3 4 4  0  Altered sleeping 3 3 3 3  0  Tired, decreased energy 3 3 3 2  0  Change in appetite 1 2 3 2  0  Feeling bad or failure about yourself  1 1 3 3  0  Trouble concentrating 3 3 3 2  0  Moving slowly or fidgety/restless 1 0 3 2 0  Suicidal thoughts 0 1 2 2  0  PHQ-9 Score 14 16 24 20  0  Difficult doing work/chores Somewhat difficult Somewhat difficult Extremely dIfficult Very difficult Not difficult at all     Patient Active Problem List   Diagnosis Date Noted  . Generalized abdominal pain   . Rectum inflammation   . Internal hemorrhoids   . Diverticulosis of small intestine without hemorrhage   . Nausea and vomiting   . Lower abdominal pain 01/29/2019  . Diarrhea 01/29/2019  . GAD (generalized anxiety disorder) 01/29/2019  . Severe episode of recurrent major depressive disorder, without psychotic features ([place]) 01/29/2019  . Panic disorder with agoraphobia 06/19/2018    Social History   Tobacco Use  .  Smoking status: Never Smoker  . Smokeless tobacco: Never Used  Substance Use Topics  . Alcohol use: Yes    Comment: occasionally     Current Outpatient Medications:  .  diazepam (VALIUM) 5 MG tablet, Insert 5-10 mg (1 to 2 tablets) vaginal nightly for one (1) month then insert vaginal three (3) times per week as needed for a total of 16 weeks. Refills require office visit., Disp: 60 tablet, Rfl: 0 .  norgestimate-ethinyl estradiol (ORTHO-CYCLEN) 0.25-35 MG-MCG tablet, Take 1 tablet by mouth daily., Disp: , Rfl:   No Known Allergies  I personally reviewed active problem list, medication list, allergies, notes from last encounter, lab results with the patient/caregiver today.  ROS  Ten systems reviewed and is negative except as mentioned in HPI  Objective  Vitals:   09/23/19 1252  BP: 118/68  Pulse: 96  Resp: 14  Temp: (!) 97.3 F (36.3 C)  TempSrc: Temporal  SpO2: 98%  Weight: 157 lb 14.4 oz (71.6 kg)  Height: 5\' 5"  (1.651 m)   Body mass index is 26.28 kg/m.  Nursing Note and Vital Signs reviewed.  Physical Exam  Constitutional: Patient appears well-developed and well-nourished.  No distress.  HEENT: head atraumatic, normocephalic, pupils equal and reactive to light, Bilateral TM's without erythema or effusion,  bilateral  maxillary and frontal sinuses are non-tender, neck supple without lymphadenopathy, throat within normal limits - no erythema or exudate, no tonsillar swelling.  The left postauricular region, just below the mastoid has ill-defined small mass that does not have fluctuance, erythema, or tenderness.  Cardiovascular: Normal rate, regular rhythm and normal heart sounds.  No murmur heard. No BLE edema. Pulmonary/Chest: Effort normal and breath sounds clear bilaterally. No respiratory distress. Psychiatric: Patient has a normal mood and affect. behavior is normal. Judgment and thought content normal.  No results found for this or any previous visit (from the  past 72 hour(s)).  Assessment & Plan  1. Lymphadenopathy of head and neck - Ambulatory referral to ENT - Unclear etiology, suspect enlarged lymph node, however possibly mass of other origin.  Discussed option to treat with Abx first, but she declines as she is currently in evaluation for interstitial cystitis and is trying to avoid abx if possible.  2. Severe episode of recurrent major depressive disorder, without psychotic features (HCC) - Declines referral to new psychiatrist today, does not want medication.  She does deny SI/HI, I encouraged her to seek psychiatry care ASAP and I am happy to place referral if needed.

## 2019-09-23 NOTE — Telephone Encounter (Signed)
Patient notified

## 2019-09-23 NOTE — Telephone Encounter (Signed)
Spoke with Elon Alas PT regarding patient's hip pain - states strong family history hip dysplasia and patient has chronic hip pain.  Will obtain plain films.  Order is placed. Please ask patient to go to [facility] to have done at her convenience.

## 2019-09-27 ENCOUNTER — Other Ambulatory Visit: Payer: Self-pay

## 2019-09-27 ENCOUNTER — Ambulatory Visit
Admission: RE | Admit: 2019-09-27 | Discharge: 2019-09-27 | Disposition: A | Discharge status: Home / Self Care | Payer: BC Managed Care – PPO | Source: Ambulatory Visit | Attending: Family Medicine | Admitting: Family Medicine

## 2019-09-27 ENCOUNTER — Other Ambulatory Visit: Payer: BC Managed Care – PPO

## 2019-09-27 ENCOUNTER — Ambulatory Visit: Payer: BC Managed Care – PPO | Admitting: Urology

## 2019-09-27 DIAGNOSIS — M25551 Pain in right hip: Secondary | ICD-10-CM | POA: Insufficient documentation

## 2019-09-27 DIAGNOSIS — Z0389 Encounter for observation for other suspected diseases and conditions ruled out: Secondary | ICD-10-CM | POA: Diagnosis not present

## 2019-09-27 DIAGNOSIS — M25552 Pain in left hip: Secondary | ICD-10-CM

## 2019-09-27 DIAGNOSIS — G8929 Other chronic pain: Secondary | ICD-10-CM

## 2019-09-28 ENCOUNTER — Ambulatory Visit: Payer: BC Managed Care – PPO

## 2019-09-28 DIAGNOSIS — R2689 Other abnormalities of gait and mobility: Secondary | ICD-10-CM | POA: Diagnosis not present

## 2019-09-28 DIAGNOSIS — N3946 Mixed incontinence: Secondary | ICD-10-CM

## 2019-09-28 DIAGNOSIS — R293 Abnormal posture: Secondary | ICD-10-CM | POA: Diagnosis not present

## 2019-09-28 DIAGNOSIS — M62838 Other muscle spasm: Secondary | ICD-10-CM

## 2019-09-28 NOTE — Patient Instructions (Signed)
   Start with Shoulder Retraction and Downward Rotation pictures above, holding the position while pulling the chin straight back as if trying to make a "double chin".  Breathe in forward and breathe out as you pull back, repeating this _10x3__ times __1-3__ times per day.

## 2019-09-28 NOTE — Therapy (Signed)
[place] MAIN [facility] SERVICES [address] [place], [place], [address] Phone: [phone]   Fax:  [phone]  Physical Therapy Treatment  The patient has been informed of current processes in place at Outpatient Rehab to protect patients from Covid-19 exposure including social distancing, schedule modifications, and new cleaning procedures. After discussing their particular risk with a therapist based on the patient's personal risk factors, the patient has decided to proceed with in-person therapy.   Patient Details  Name: Olivia Sullivan MRN: [id number] Date of Birth: [birth year] Referring Provider (PT): Bluford Main CNM    Encounter Date: 09/28/2019  PT End of Session - 09/28/19 1659    Visit Number  5    Number of Visits  10    Date for PT Re-Evaluation  10/19/19    Authorization - Visit Number  5    Authorization - Number of Visits  10    PT Start Time  1224    PT Stop Time  8250    PT Time Calculation (min)  60 min    Activity Tolerance  Patient tolerated treatment well;No increased pain    Behavior During Therapy  WFL for tasks assessed/performed       Past Medical History:  Diagnosis Date  . Anxiety   . Depression   . Migraine     Past Surgical History:  Procedure Laterality Date  . COLONOSCOPY WITH PROPOFOL N/A 03/11/2019   Procedure: COLONOSCOPY WITH PROPOFOL;  Surgeon: Virgel Manifold, MD;  Location: [facility] ENDOSCOPY;  Service: Endoscopy;  Laterality: N/A;  . ESOPHAGOGASTRODUODENOSCOPY (EGD) WITH PROPOFOL N/A 03/11/2019   Procedure: ESOPHAGOGASTRODUODENOSCOPY (EGD) WITH PROPOFOL;  Surgeon: Virgel Manifold, MD;  Location: [facility] ENDOSCOPY;  Service: Endoscopy;  Laterality: N/A;  . NO PAST SURGERIES      There were no vitals filed for this visit.    Pelvic Floor Physical Therapy Treatment Note  SCREENING  Changes in medications, allergies, or medical history?: No    SUBJECTIVE  Patient reports: Pain was  about the same (very low) this week except yesterday and Sunday pain was increased in low back and radiated up to the ribs and upper back. only idea for why is that work has been busier. Burning with urination but no LBP today. Pain hit her after work when she got home and was relaxing after work. Still has just a little pain relief with TENS use, not much and no improved urination.  Precautions:  N/A   Pain update:  Location of pain 1 : LBP (neck) Current pain:  0/10 (3) Max pain:  7/10 (6) Least pain:  0/10  Nature of pain: sharp pain at the base of her spine. Both sides even (R>L and into shoulder)   Pain at vulva: Burning sensation: 2/10 on NPS  Max: 7/10 on NPS  Min: 1-2/10 - something is always present  Ice assists. Few times a week. After trying intercourse.   Location of pain 2 B hip (R >L)  Current pain: 0/10 on NPS  Max pain: 0/10 on NPS (when up and walking) Least: 0/10 on NPS (dull)  With walking the R hip with pop after about 20 minutes - sharp pain   **no pain following session in neck, back, or hips   Patient Goals: To be pain free. The most aggravating is with urination.    OBJECTIVE  Changes in: Posture/Observations:  L ASIS high, PSIS low in standing (mild R anterior rotation)  In supine, Pt. Leans head  and shoulders L but feels like she is laying straight.  -Improved by ~ 80% following treatment.  Range of Motion/Flexibilty:  Pain increased to 6/10 with AROM of neck in all planes pre-treatment.  Strength/MMT: N/A   Pelvic floor:  N/A  Abdominal:    Palpation: TTP to R OI externally, QL, Psoas, and Glute med.  Gait Analysis: N/A   INTERVENTIONS THIS SESSION: Manual Therapy: Performed grade 2-4 R to L mobs to C1-C3 and C5 and TP release to B cervical extensors, R SCM, upper trap, and supraspinatus to improve mobility of joint and surrounding connective tissue and decrease pressure on nerve roots for improved conductivity and function of  down-stream tissues as well as decrease pain in the neck and shoulder.    Therex: educated on and practiced chin-tucks in seated to maintain improved mobility and strengthen deep cervical flexors as well as stretch extensors to help maintain improved head and neck pain.    Total time: 60 minutes                             PT Short Term Goals - 08/11/19 1052      PT SHORT TERM GOAL #1   Title  Patient will demonstrate a coordinated contraction, relaxation, and bulge of the pelvic floor muscles to demonstrate functional recruitment and motion and allow for further strengthening.    Baseline  paradoxical TA activation with breathing    Time  5    Period  Weeks    Status  New    Target Date  09/14/19      PT SHORT TERM GOAL #2   Title  Patient will demonstrate appropriate body mechanics with ambulation to allow for decreased stress on the pelvic floor and low back.    Baseline  antalgic, slow gait    Time  5    Period  Weeks    Status  New    Target Date  09/14/19      PT SHORT TERM GOAL #3   Title  Patient will report a reduction in pain to no greater than 5/10 over the prior week to demonstrate symptom improvement.    Baseline  7-8/10 on NPS at LBP, hips and vulva    Time  5    Period  Weeks    Status  New    Target Date  09/14/19        PT Long Term Goals - 08/11/19 1056      PT LONG TERM GOAL #1   Title  Patient will report no episodes of mixed UI over the course of the prior two weeks to demonstrate improved functional ability.    Baseline  mixed UI- urgency and stress leakage every day    Time  10    Period  Weeks    Status  New    Target Date  10/19/19      PT LONG TERM GOAL #2   Title  Patient will demonstrate improvement on Female Sexual Function Index (FSFI) from 23 to 46 demonstrating improved QOL with sexual functions.    Baseline  23/100- indicating high risk for sexual dysfunction    Time  10    Period  Weeks    Status  New     Target Date  10/19/19      PT LONG TERM GOAL #3   Title  Patient will score less than or equal to 20% on the  Female NIH-CPSI to demonstrate a reduction in pain, urinary symptoms, and an improved quality of life.    Baseline  34/44 (78%)    Time  10    Period  Weeks    Status  New    Target Date  10/19/19      PT LONG TERM GOAL #4   Title  Patient will describe pain no greater than 2/10 during ADL's, work, and following intercourse to demonstrate improved functional ability.    Baseline  7-8/10 on NPS at LBP, hips and vulva increased by above activities.    Time  10    Period  Weeks    Status  New    Target Date  10/19/19            Plan - 09/28/19 1659    Clinical Impression Statement  Pt. Responded well to all interventions today, demonstrating improved cervical mobility and alignment, and restored pain-free ROM as well as understanding and correct performance of all education and exercises provided today. They will continue to benefit from skilled physical therapy to work toward remaining goals and maximize function as well as decrease likelihood of symptom increase or recurrence.     Personal Factors and Comorbidities  Behavior Pattern;Comorbidity 2;Past/Current Experience;Time since onset of injury/illness/exacerbation    Comorbidities  anxiety/depression/generalized personality/disorder; headaches/migraines.    Examination-Activity Limitations  Sit;Stand;Toileting;Continence;Hygiene/Grooming    Examination-Participation Restrictions  Interpersonal Relationship;Community Activity    Stability/Clinical Decision Making  Unstable/Unpredictable    Clinical Decision Making  High    Rehab Potential  Good    PT Frequency  2x / week    PT Duration  --   10 weeks   PT Treatment/Interventions  ADLs/Self Care Home Management;Biofeedback;Electrical Stimulation;Moist Heat;Traction;Ultrasound;Stair training;Functional mobility training;Therapeutic activities;Gait training;Balance  training;Therapeutic exercise;Neuromuscular re-education;Patient/family education;Manual techniques;Dry needling;Energy conservation;Spinal Manipulations;Joint Manipulations    PT Next Visit Plan  address pain/ TTP; improve pelvic alignment; re-check for LLD when pelvic alignment is improved; when appropriate address external/internal PFM;    PT Home Exercise Plan  diaphragmatic breathing in seated and hook-lying; side stretch (R>L); seated pelvic tilts; sleeping positions; R anterior innominate correction; bridges    Consulted and Agree with Plan of Care  Patient       Patient will benefit from skilled therapeutic intervention in order to improve the following deficits and impairments:  Abnormal gait, Decreased activity tolerance, Decreased balance, Decreased coordination, Decreased endurance, Decreased mobility, Decreased range of motion, Decreased strength, Increased muscle spasms, Increased fascial restricitons, Postural dysfunction, Improper body mechanics, Pain  Visit Diagnosis: Other muscle spasm  Mixed incontinence  Abnormal posture  Other abnormalities of gait and mobility     Problem List Patient Active Problem List   Diagnosis Date Noted  . Generalized abdominal pain   . Rectum inflammation   . Internal hemorrhoids   . Diverticulosis of small intestine without hemorrhage   . Nausea and vomiting   . Lower abdominal pain 01/29/2019  . Diarrhea 01/29/2019  . GAD (generalized anxiety disorder) 01/29/2019  . Severe episode of recurrent major depressive disorder, without psychotic features (HCC) 01/29/2019  . Panic disorder with agoraphobia 06/19/2018   Cleophus Molt DPT, ATC Cleophus Molt 09/28/2019, 5:02 PM  [facility] MAIN [facility] SERVICES [address] [place], [place], [address] Phone: [phone]   Fax:  [phone]  Name: Olivia Sullivan MRN: [id number] Date of Birth: 1995-10-14

## 2019-10-04 ENCOUNTER — Other Ambulatory Visit: Payer: Self-pay

## 2019-10-04 ENCOUNTER — Ambulatory Visit: Payer: BC Managed Care – PPO | Admitting: Urology

## 2019-10-04 ENCOUNTER — Encounter: Payer: Self-pay | Admitting: Urology

## 2019-10-04 VITALS — BP 124/84 | HR 106 | Ht 65.0 in | Wt 155.0 lb

## 2019-10-04 DIAGNOSIS — N3020 Other chronic cystitis without hematuria: Secondary | ICD-10-CM | POA: Diagnosis not present

## 2019-10-04 NOTE — Progress Notes (Signed)
10/04/2019 2:38 PM   Olivia Sullivan Feb 08, 1996 [id number]  Referring provider: Doren Custard, FNP [address] [place],  [place] [address]  Chief Complaint  Patient presents with  . Results    HPI: I was consulted to assess the patient's chronic history of dysuria and frequency.  She can void every 30 minutes and cannot hold it for 2 hours.  She has no nocturia.  She is a reasonable flow but does not necessarily feel empty  She has uncommon urge and stress incontinence but does not wear a pad.    She describes negative urine cultures and having this problem for many years.  She even had issues as a child.  She has dyspareunia.  She does not get a lot of suprapubic discomfort.  The burning is near the urethra and vaginal opening when she urinates.  She has a history of irritable bowel syndrome  On pelvic examination the patient was a bit nervous.  She had excellent bladder support.  No diverticulum.  Levator muscles a bit tender as was her bladder.  Patient has significant frequency chronic burning and mild mixed incontinence.  She may have interstitial cystitis.  She may need a hydrodistention in the future.  She was quite tender but also nervous that may have an element of vulvodynia.  My index suspicion is moderately high that she has interstitial cystitis.  I mentioned a bladder hydrodistention briefly today as well.  She understands the concept of an inflamed bladder.  Next visit I will also ask her what symptom or sensation is making her void every 30 minutes  Today Frequency stable.  Dysuria stable.  On urodynamics patient did not void and was catheterized for a few milliliters.  Maximum bladder capacity 230 mL.  She had some cramping in the lower abdomen during bladder filling.  Her bladder was hypersensitive especially at low bladder volumes.  Bladder was unstable reaching a pressure of 5 cm of water.  She did not leak.  No stress incontinence with a Valsalva  pressure of 85 cm water.  Near the end of her voluntary void she had a spiking pressure and rated pain as a 10 out of 10.  It reduced to 8 out of 10 after voiding.  She had urethral burning and cramping.  She voided 220 mL with a maximum flow 20 mils per second.  Maximum voiding pressure 39 cm water.  EMG activity increased during voiding.  Bladder neck descent at 1 cm.  The voiding spiking pressure in my opinion was a terminal post void involuntary contraction no evidence of neurogenic bladder dysfunction  Test highly suggest interstitial cystitis.  Hydrodistention discussed.  Usual template described   PMH: Past Medical History:  Diagnosis Date  . Anxiety   . Depression   . Migraine     Surgical History: Past Surgical History:  Procedure Laterality Date  . COLONOSCOPY WITH PROPOFOL N/A 03/11/2019   Procedure: COLONOSCOPY WITH PROPOFOL;  Surgeon: [name], MD;  Location: [facility] END[facility]COPY;  Service: Endoscopy;  Laterality: N/A;  . ESOPHAGOGASTRODUODENOSCOPY (EGD) WITH PROPOFOL N/A 03/11/2019   Procedure: ESOPHAGOGASTRODUODENOSCOPY (EGD) WITH PROPOFOL;  Surgeon: [name], MD;  Location: [facility] ENDOSCOPY;  Service: Endoscopy;  Laterality: N/A;  . NO PAST SURGERIES      Home Medications:  Allergies as of 10/04/2019   No Known Allergies     Medication List       Accurate as of October 04, 2019  2:38 PM. If you  have any questions, ask your nurse or doctor.        diazepam 5 MG tablet Commonly known as: VALIUM Insert 5-10 mg (1 to 2 tablets) vaginal nightly for one (1) month then insert vaginal three (3) times per week as needed for a total of 16 weeks. Refills require office visit.   norgestimate-ethinyl estradiol 0.25-35 MG-MCG tablet Commonly known as: ORTHO-CYCLEN Take 1 tablet by mouth daily.       Allergies: No Known Allergies  Family History: Family History  Problem Relation Age of Onset  . Diabetes Father   . Depression Father   . Anxiety  disorder Father   . Depression Sister   . Anxiety disorder Sister   . Irritable bowel syndrome Sister   . Depression Brother   . Anxiety disorder Brother   . Irritable bowel syndrome Brother   . Diabetes Maternal Grandmother   . Endometriosis Maternal Grandmother   . Endometriosis Maternal Aunt   . Breast cancer Neg Hx   . Ovarian cancer Neg Hx   . Colon cancer Neg Hx     Social History:  reports that she has never smoked. She has never used smokeless tobacco. She reports current alcohol use. She reports that she does not use drugs.  ROS: UROLOGY Frequent Urination?: Yes Hard to postpone urination?: Yes Burning/pain with urination?: Yes Get up at night to urinate?: No Leakage of urine?: Yes Urine stream starts and stops?: No Trouble starting stream?: No Do you have to strain to urinate?: No Blood in urine?: No Urinary tract infection?: No Sexually transmitted disease?: No Injury to kidneys or bladder?: No Painful intercourse?: No Weak stream?: No Currently pregnant?: No Vaginal bleeding?: No Last menstrual period?: N  Gastrointestinal Nausea?: No Vomiting?: No Indigestion/heartburn?: No Diarrhea?: No Constipation?: No  Constitutional Fever: No Night sweats?: No Weight loss?: No Fatigue?: No  Skin Skin rash/lesions?: No Itching?: No  Eyes Blurred vision?: No Double vision?: No  Ears/Nose/Throat Sore throat?: No Sinus problems?: No  Hematologic/Lymphatic Swollen glands?: No Easy bruising?: No  Cardiovascular Leg swelling?: No Chest pain?: No  Respiratory Cough?: No Shortness of breath?: No  Endocrine Excessive thirst?: No  Musculoskeletal Back pain?: No Joint pain?: No  Neurological Headaches?: No Dizziness?: No  Psychologic Depression?: No Anxiety?: No  Physical Exam: BP 124/84   Pulse (!) 106   Ht 5\' 5"  (1.651 m)   Wt 70.3 kg   LMP 09/20/2019   BMI 25.79 kg/m   Constitutional:  Alert and oriented, No acute  distress.  Laboratory Data: Lab Results  Component Value Date   WBC 9.0 05/17/2019   HGB 13.4 05/17/2019   HCT 40.5 05/17/2019   MCV 84.2 05/17/2019   PLT 384 05/17/2019    Lab Results  Component Value Date   CREATININE 0.74 05/17/2019    No results found for: PSA  No results found for: TESTOSTERONE  Lab Results  Component Value Date   HGBA1C 4.9 05/19/2019    Urinalysis    Component Value Date/Time   APPEARANCEUR Cloudy (A) 08/30/2019 1014   GLUCOSEU Negative 08/30/2019 1014   BILIRUBINUR Negative 08/30/2019 1014   PROTEINUR Negative 08/30/2019 1014   UROBILINOGEN 0.2 07/19/2019 1420   NITRITE Negative 08/30/2019 1014   LEUKOCYTESUR Negative 08/30/2019 1014    Pertinent Imaging:   Assessment & Plan: Proceed with hydrodistention.  My index of suspicion is high she has interstitial cystitis.  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  No follow-ups on file.  Reece Packer, MD  [facility] [address], [place], [place] [address] [phone]

## 2019-10-05 ENCOUNTER — Ambulatory Visit: Payer: BC Managed Care – PPO

## 2019-10-05 DIAGNOSIS — N3946 Mixed incontinence: Secondary | ICD-10-CM

## 2019-10-05 DIAGNOSIS — R2689 Other abnormalities of gait and mobility: Secondary | ICD-10-CM

## 2019-10-05 DIAGNOSIS — M62838 Other muscle spasm: Secondary | ICD-10-CM | POA: Diagnosis not present

## 2019-10-05 DIAGNOSIS — R293 Abnormal posture: Secondary | ICD-10-CM | POA: Diagnosis not present

## 2019-10-05 NOTE — Patient Instructions (Signed)
    This is The QL muscle  To perform release on this muscle, start by getting into this position by bridging the hips up and then slowly lowering your back, then your butt down to lengthen the low back then put the ball under you where you feel the tender spot and roll to the same side slightly to add pressure as needed. Hold still and take deep breaths until the pain is at least 50% less or, ideally, just pressure.   This is your piriformis    To release this muscle start in this position with your ankle crossed over the opposite knee. Place the tennis ball under your buttock where the tender spot is and then slightly roll your weight to the same side to put just enough pressure that it is uncomfortable. Hold and take deep breaths until the pain is at least 50% less or, ideally ,just pressure.   This is your Gluteus Medius and Minimus  To perform release on this muscle, start by getting into this position by bridging the hips up and then slowly lowering your back, then your butt down to lengthen the low back then put the ball under you where you feel the tender spot and roll to the same side slightly to add pressure as needed. Hold still and take deep breaths until the pain is at least 50% less or, ideally, just pressure.   These are your deep hip-flexor muscles. They are easiest to reach where they come together at the hip.   To perform release on this muscle, start by getting into this position by laying on your stomach then put the ball under you where you feel the tender spot and bring the opposite knee up/out to the side to add pressure as needed. Hold still and take deep breaths until the pain is at least 50% less or, ideally, just pressure.     Place at base of skull and tuck gently to feel the pressure, then hold and breathe until the pain fades by at least 50%.   Exhale and tuck, repeat 2x10 in the morning and at night.

## 2019-10-05 NOTE — Therapy (Signed)
[facility] MAIN [facility] SERVICES [address] [place], [place], [address] Phone: [phone]   Fax:  [phone]  Physical Therapy Treatment  The patient has been informed of current processes in place at Outpatient Rehab to protect patients from Covid-19 exposure including social distancing, schedule modifications, and new cleaning procedures. After discussing their particular risk with a therapist based on the patient's personal risk factors, the patient has decided to proceed with in-person therapy.   Patient Details  Name: Olivia Sullivan MRN: [id number] Date of Birth: 1996-07-19 Referring Provider (PT): Rose Phi CNM    Encounter Date: 10/05/2019  PT End of Session - 10/07/19 0836    Visit Number  6    Number of Visits  10    Date for PT Re-Evaluation  10/19/19    Authorization - Visit Number  6    Authorization - Number of Visits  10    PT Start Time  1530    PT Stop Time  1630    PT Time Calculation (min)  60 min    Activity Tolerance  Patient tolerated treatment well;No increased pain    Behavior During Therapy  WFL for tasks assessed/performed       Past Medical History:  Diagnosis Date  . Anxiety   . Depression   . Migraine     Past Surgical History:  Procedure Laterality Date  . COLONOSCOPY WITH PROPOFOL N/A 03/11/2019   Procedure: COLONOSCOPY WITH PROPOFOL;  Surgeon: [name], MD;  Location: [facility] ENDOSCOPY;  Service: Endoscopy;  Laterality: N/A;  . ESOPHAGOGASTRODUODENOSCOPY (EGD) WITH PROPOFOL N/A 03/11/2019   Procedure: ESOPHAGOGASTRODUODENOSCOPY (EGD) WITH PROPOFOL;  Surgeon: [name], MD;  Location: [facility] ENDOSCOPY;  Service: Endoscopy;  Laterality: N/A;  . NO PAST SURGERIES      There were no vitals filed for this visit.    Pelvic Floor Physical Therapy Treatment Note  SCREENING  Changes in medications, allergies, or medical history?: No    SUBJECTIVE  Patient reports: Pain in neck  is 2-3 in neutral, gets up to ~ 6 with looking up. R Hip was doing well until today and has some pain ~ 4/10 with walking.  Precautions:  N/A   Pain update:  Location of pain 1 :  (neck) Current pain:  3/10  Max pain: 6/10  Least pain:  0/10  Nature of pain: sharp pain at the base of her spine.   Pain at vulva: Burning sensation: 2/10 on NPS  Max: 7/10 on NPS  Min: 1-2/10 - something is always present  Ice assists. Few times a week. After trying intercourse.   Location of pain 2 B hip (R >L)  Current pain: 0/10 on NPS  Max pain: 4/10 on NPS (when up and walking) Least: 0/10 on NPS (dull)   **no pain following session in neck, back, or hips   Patient Goals: To be pain free. The most aggravating is with urination.    OBJECTIVE  Changes in: Posture/Observations:  FHP, slight R deviation of spinous process.  Range of Motion/Flexibilty:  Pain increased to 6/10 with AROM of neck in extension only  Strength/MMT: N/A   Pelvic floor:   No TTP to external PFM  Abdominal:    Palpation: TTP to R Iliacus and Psoas, superficial and deep cervical extensors on the R and B sub occipitals  Gait Analysis: N/A   INTERVENTIONS THIS SESSION: Manual Therapy: assessed PFM externally over her leggings and Performed TP release to R Iliacus  and Psoas, superficial and deep cervical extensors on the R and B sub occipitals followed by grade 2-3 mobs from R to L at C2-3 to decrease spasm and pain and allow for improved balance of musculature for improved function and decreased symptoms as well as to improve mobility of joint and surrounding connective tissue and decrease pressure on nerve roots for improved conductivity and function of down-stream tissues.      Therex: educated on and practiced supine chin-tucks to maintain improved mobility and strengthen deep cervical flexors as well as stretch extensors to help maintain improved head and neck pain. Educated on how to perform self TP  release to sub-occipitals as well as muscles surrounding the low back and hips to self manage spasms at home and prevent return of pain to allow for strengthening.    Total time: 60 minutes                            PT Short Term Goals - 08/11/19 1052      PT SHORT TERM GOAL #1   Title  Patient will demonstrate a coordinated contraction, relaxation, and bulge of the pelvic floor muscles to demonstrate functional recruitment and motion and allow for further strengthening.    Baseline  paradoxical TA activation with breathing    Time  5    Period  Weeks    Status  New    Target Date  09/14/19      PT SHORT TERM GOAL #2   Title  Patient will demonstrate appropriate body mechanics with ambulation to allow for decreased stress on the pelvic floor and low back.    Baseline  antalgic, slow gait    Time  5    Period  Weeks    Status  New    Target Date  09/14/19      PT SHORT TERM GOAL #3   Title  Patient will report a reduction in pain to no greater than 5/10 over the prior week to demonstrate symptom improvement.    Baseline  7-8/10 on NPS at LBP, hips and vulva    Time  5    Period  Weeks    Status  New    Target Date  09/14/19        PT Long Term Goals - 08/11/19 1056      PT LONG TERM GOAL #1   Title  Patient will report no episodes of mixed UI over the course of the prior two weeks to demonstrate improved functional ability.    Baseline  mixed UI- urgency and stress leakage every day    Time  10    Period  Weeks    Status  New    Target Date  10/19/19      PT LONG TERM GOAL #2   Title  Patient will demonstrate improvement on Female Sexual Function Index (FSFI) from 23 to 46 demonstrating improved QOL with sexual functions.    Baseline  23/100- indicating high risk for sexual dysfunction    Time  10    Period  Weeks    Status  New    Target Date  10/19/19      PT LONG TERM GOAL #3   Title  Patient will score less than or equal to 20% on the  Female NIH-CPSI to demonstrate a reduction in pain, urinary symptoms, and an improved quality of life.    Baseline  34/44 (78%)  Time  10    Period  Weeks    Status  New    Target Date  10/19/19      PT LONG TERM GOAL #4   Title  Patient will describe pain no greater than 2/10 during ADL's, work, and following intercourse to demonstrate improved functional ability.    Baseline  7-8/10 on NPS at LBP, hips and vulva increased by above activities.    Time  10    Period  Weeks    Status  New    Target Date  10/19/19            Plan - 10/07/19 0837    Clinical Impression Statement  Pt. Responded well to all interventions today, demonstrating resolution of pain and spasm as well as understanding and correct performance of all education and exercises provided today. They will continue to benefit from skilled physical therapy to work toward remaining goals and maximize function as well as decrease likelihood of symptom increase or recurrence.     Personal Factors and Comorbidities  Behavior Pattern;Comorbidity 2;Past/Current Experience;Time since onset of injury/illness/exacerbation    Comorbidities  anxiety/depression/generalized personality/disorder; headaches/migraines.    Examination-Activity Limitations  Sit;Stand;Toileting;Continence;Hygiene/Grooming    Examination-Participation Restrictions  Interpersonal Relationship;Community Activity    Stability/Clinical Decision Making  Unstable/Unpredictable    Rehab Potential  Good    PT Frequency  2x / week    PT Duration  --   10 weeks   PT Treatment/Interventions  ADLs/Self Care Home Management;Biofeedback;Electrical Stimulation;Moist Heat;Traction;Ultrasound;Stair training;Functional mobility training;Therapeutic activities;Gait training;Balance training;Therapeutic exercise;Neuromuscular re-education;Patient/family education;Manual techniques;Dry needling;Energy conservation;Spinal Manipulations;Joint Manipulations    PT Next Visit  Plan  assess and  address internal PFM, re-check for LLD when pelvic alignment is improved;    PT Home Exercise Plan  diaphragmatic breathing in seated and hook-lying; side stretch (R>L); seated pelvic tilts; sleeping positions; R anterior innominate correction; bridges, supine and seated chin-tucks    Consulted and Agree with Plan of Care  Patient       Patient will benefit from skilled therapeutic intervention in order to improve the following deficits and impairments:  Abnormal gait, Decreased activity tolerance, Decreased balance, Decreased coordination, Decreased endurance, Decreased mobility, Decreased range of motion, Decreased strength, Increased muscle spasms, Increased fascial restricitons, Postural dysfunction, Improper body mechanics, Pain  Visit Diagnosis: Other muscle spasm  Mixed incontinence  Abnormal posture  Other abnormalities of gait and mobility     Problem List Patient Active Problem List   Diagnosis Date Noted  . Generalized abdominal pain   . Rectum inflammation   . Internal hemorrhoids   . Diverticulosis of small intestine without hemorrhage   . Nausea and vomiting   . Lower abdominal pain 01/29/2019  . Diarrhea 01/29/2019  . GAD (generalized anxiety disorder) 01/29/2019  . Severe episode of recurrent major depressive disorder, without psychotic features (HCC) 01/29/2019  . Panic disorder with agoraphobia 06/19/2018   Cleophus MoltKeeli T. Kaimani Clayson DPT, ATC Cleophus MoltKeeli T Bonnye Halle 10/07/2019, 8:39 AM  [facility] [address] [place], [place], [address] Phone: [phone]   Fax:  [phone]  Name: Bethena RoysKaitlyn Gartner MRN: [id number] Date of Birth: 01-15-1996

## 2019-10-06 DIAGNOSIS — L040 Acute lymphadenitis of face, head and neck: Secondary | ICD-10-CM | POA: Diagnosis not present

## 2019-10-11 ENCOUNTER — Other Ambulatory Visit: Payer: Self-pay | Admitting: Urology

## 2019-10-11 DIAGNOSIS — N3020 Other chronic cystitis without hematuria: Secondary | ICD-10-CM

## 2019-10-12 ENCOUNTER — Ambulatory Visit: Payer: BC Managed Care – PPO

## 2019-10-18 ENCOUNTER — Encounter
Admission: RE | Admit: 2019-10-18 | Discharge: 2019-10-18 | Disposition: A | Discharge status: Home / Self Care | Payer: BC Managed Care – PPO | Source: Ambulatory Visit | Attending: Urology | Admitting: Urology

## 2019-10-18 ENCOUNTER — Other Ambulatory Visit: Payer: Self-pay

## 2019-10-18 NOTE — Patient Instructions (Signed)
Your procedure is scheduled on: 10/25/19 Report to DAY SURGERY DEPARTMENT LOCATED ON 2ND FLOOR MEDICAL MALL ENTRANCE. To find out your arrival time please call [phone] between 1PM - 3PM on 10/22/19.  Remember: Instructions that are not followed completely may result in serious medical risk, up to and including death, or upon the discretion of your surgeon and anesthesiologist your surgery may need to be rescheduled.     _X__ 1. Do not eat food after midnight the night before your procedure.                 No gum chewing or hard candies. You may drink clear liquids up to 2 hours                 before you are scheduled to arrive for your surgery- DO not drink clear                 liquids within 2 hours of the start of your surgery.                 Clear Liquids include:  water, apple juice without pulp, clear carbohydrate                 drink such as Clearfast or Gatorade, Black Coffee or Tea (Do not add                 anything to coffee or tea). Diabetics water only  __X__2.  On the morning of surgery brush your teeth with toothpaste and water, you                 may rinse your mouth with mouthwash if you wish.  Do not swallow any              toothpaste of mouthwash.     _X__ 3.  No Alcohol for 24 hours before or after surgery.   _X__ 4.  Do Not Smoke or use e-cigarettes For 24 Hours Prior to Your Surgery.                 Do not use any chewable tobacco products for at least 6 hours prior to                 surgery.  ____  5.  Bring all medications with you on the day of surgery if instructed.   __X__  6.  Notify your doctor if there is any change in your medical condition      (cold, fever, infections).     Do not wear jewelry, make-up, hairpins, clips or nail polish. Do not wear lotions, powders, or perfumes.  Do not shave 48 hours prior to surgery. Men may shave face and neck. Do not bring valuables to the hospital.    [facility] is not responsible for any belongings or  valuables.  Contacts, dentures/partials or body piercings may not be worn into surgery. Bring a case for your contacts, glasses or hearing aids, a denture cup will be supplied. Leave your suitcase in the car. After surgery it may be brought to your room. For patients admitted to the hospital, discharge time is determined by your treatment team.   Patients discharged the day of surgery will not be allowed to drive home.   Please read over the following fact sheets that you were given:   MRSA Information  __X__ Take these medicines the morning of surgery with A SIP OF WATER:  1. none  2.   3.   4.  5.  6.  ____ Fleet Enema (as directed)   __X__ Use CHG Soap/SAGE wipes as directed  ____ Use inhalers on the day of surgery  ____ Stop metformin/Janumet/Farxiga 2 days prior to surgery    ____ Take 1/2 of usual insulin dose the night before surgery. No insulin the morning          of surgery.   ____ Stop Blood Thinners Coumadin/Plavix/Xarelto/Pleta/Pradaxa/Eliquis/Effient/Aspirin  on   Or contact your Surgeon, Cardiologist or Medical Doctor regarding  ability to stop your blood thinners  __X__ Stop Anti-inflammatories 7 days before surgery such as Advil, Ibuprofen, Motrin,  BC or Goodies Powder, Naprosyn, Naproxen, Aleve, Aspirin    __X__ Stop all herbal supplements, fish oil or vitamin E until after surgery.    ____ Bring C-Pap to the hospital.

## 2019-10-19 ENCOUNTER — Ambulatory Visit: Payer: BC Managed Care – PPO | Attending: Certified Nurse Midwife

## 2019-10-19 DIAGNOSIS — M62838 Other muscle spasm: Secondary | ICD-10-CM | POA: Insufficient documentation

## 2019-10-19 DIAGNOSIS — N3946 Mixed incontinence: Secondary | ICD-10-CM | POA: Insufficient documentation

## 2019-10-19 DIAGNOSIS — R2689 Other abnormalities of gait and mobility: Secondary | ICD-10-CM | POA: Diagnosis not present

## 2019-10-19 DIAGNOSIS — R293 Abnormal posture: Secondary | ICD-10-CM | POA: Insufficient documentation

## 2019-10-19 NOTE — Patient Instructions (Signed)
Child's Pose Pelvic Floor Lengthening    Sit in knee-chest position and reach arms forward. Separate knees for comfort. Hold position for _5__ breaths. Repeat _2-3__ times. Do _1-2__ times per day.   

## 2019-10-19 NOTE — Therapy (Signed)
[place] MAIN [facility] SERVICES [address] [place], [place], [address] Phone: [phone]   Fax:  [phone]  Physical Therapy Treatment  The patient has been informed of current processes in place at Outpatient Rehab to protect patients from Covid-19 exposure including social distancing, schedule modifications, and new cleaning procedures. After discussing their particular risk with a therapist based on the patient's personal risk factors, the patient has decided to proceed with in-person therapy.   Patient Details  Name: Olivia Sullivan MRN: [id number] Date of Birth: 1995-11-15 Referring Provider (PT): Bluford Main CNM    Encounter Date: 10/19/2019  PT End of Session - 10/20/19 1107    Visit Number  7    Number of Visits  10    Date for PT Re-Evaluation  10/19/19    Authorization - Visit Number  7    Authorization - Number of Visits  10    PT Start Time  1600    PT Stop Time  1700    PT Time Calculation (min)  60 min    Activity Tolerance  Patient tolerated treatment well;No increased pain    Behavior During Therapy  WFL for tasks assessed/performed       Past Medical History:  Diagnosis Date  . Anxiety   . Depression   . Migraine     Past Surgical History:  Procedure Laterality Date  . COLONOSCOPY WITH PROPOFOL N/A 03/11/2019   Procedure: COLONOSCOPY WITH PROPOFOL;  Surgeon: Virgel Manifold, MD;  Location: [facility] ENDOSCOPY;  Service: Endoscopy;  Laterality: N/A;  . ESOPHAGOGASTRODUODENOSCOPY (EGD) WITH PROPOFOL N/A 03/11/2019   Procedure: ESOPHAGOGASTRODUODENOSCOPY (EGD) WITH PROPOFOL;  Surgeon: Virgel Manifold, MD;  Location: [facility] ENDOSCOPY;  Service: Endoscopy;  Laterality: N/A;  . NO PAST SURGERIES      There were no vitals filed for this visit.     Pelvic Floor Physical Therapy Treatment Note  SCREENING  Changes in medications, allergies, or medical history?: No    SUBJECTIVE  Patient reports: Has been  taking probiotic regularly and has noticed that the burning is less when she remembers to take it. Her R shoulder pain seems to be worse overnight and in the morning. Pt. Has hydro-distension for her bladder scheduled for Jan 18th.    Precautions:  N/A   Pain update:  Location of pain 1 :  (neck/R shoulder) Current pain:  2/10  Max pain: 6/10  Least pain:  0/10  Nature of pain: sharp   Pain at vulva: Burning sensation: 4/10 on NPS  Max: 7/10 on NPS  Min: 1-2/10 - something is always present  Ice assists. Few times a week. After trying intercourse.   Location of pain 2 B hip (R >L)  Current pain: 0/10 on NPS  Max pain: 0/10 on NPS (when up and walking) Least: 0/10 on NPS (dull)   **no increased pain from treatment   Patient Goals: To be pain free. The most aggravating is with urination.    OBJECTIVE  Changes in: Posture/Observations:  FHP, slight R deviation of spinous process.  Range of Motion/Flexibilty:  Pain increased to 6/10 with AROM of neck in extension only  Strength/MMT: N/A   Pelvic floor:   Range of Motion/Flexibilty:  Spine: Hips:   Strength/MMT:  LE MMT  LE MMT Left Right  Hip flex:  (L2) /5 /5  Hip ext: /5 /5  Hip abd: /5 /5  Hip add: /5 /5  Hip IR /5 /5  Hip ER /5 /  5     Abdominal:  Palpation: Diastasis:  Pelvic Floor External Exam: Introitus Appears: WNL Skin integrity: WNL Palpation: no TTP externally to PFM, R adductors>L TTP Cough: intact Prolapse visible?: no Scar mobility: N/A  Internal Vaginal Exam: Strength (PERF): 3/5, 4 sec, 1  time  Symmetry: similar internally Palpation: TTP throughout with R anterior PR/PC re-creating burning/urgency sensation and burning at B posterior fourchette Prolapse: none  Abdominal:    Palpation: TTP to R Iliacus and Psoas, superficial and deep cervical extensors on the R and B sub occipitals  Gait Analysis: N/A   INTERVENTIONS THIS SESSION: Manual Therapy: assessed PFM and  performed TP release to anterior PR/PC and OI as well as posterior fourchette B to decrease spasm and pain and allow for improved balance of musculature for improved function and decreased symptoms.   Therex: educated on and practiced child's pose with knees wide and pillow under thighs to maintain improved PFM length and continue to decrease tension and pain.  Total time: 60 minutes                            PT Short Term Goals - 08/11/19 1052      PT SHORT TERM GOAL #1   Title  Patient will demonstrate a coordinated contraction, relaxation, and bulge of the pelvic floor muscles to demonstrate functional recruitment and motion and allow for further strengthening.    Baseline  paradoxical TA activation with breathing    Time  5    Period  Weeks    Status  New    Target Date  09/14/19      PT SHORT TERM GOAL #2   Title  Patient will demonstrate appropriate body mechanics with ambulation to allow for decreased stress on the pelvic floor and low back.    Baseline  antalgic, slow gait    Time  5    Period  Weeks    Status  New    Target Date  09/14/19      PT SHORT TERM GOAL #3   Title  Patient will report a reduction in pain to no greater than 5/10 over the prior week to demonstrate symptom improvement.    Baseline  7-8/10 on NPS at LBP, hips and vulva    Time  5    Period  Weeks    Status  New    Target Date  09/14/19        PT Long Term Goals - 08/11/19 1056      PT LONG TERM GOAL #1   Title  Patient will report no episodes of mixed UI over the course of the prior two weeks to demonstrate improved functional ability.    Baseline  mixed UI- urgency and stress leakage every day    Time  10    Period  Weeks    Status  New    Target Date  10/19/19      PT LONG TERM GOAL #2   Title  Patient will demonstrate improvement on Female Sexual Function Index (FSFI) from 23 to 46 demonstrating improved QOL with sexual functions.    Baseline  23/100-  indicating high risk for sexual dysfunction    Time  10    Period  Weeks    Status  New    Target Date  10/19/19      PT LONG TERM GOAL #3   Title  Patient will score less than  or equal to 20% on the Female NIH-CPSI to demonstrate a reduction in pain, urinary symptoms, and an improved quality of life.    Baseline  34/44 (78%)    Time  10    Period  Weeks    Status  New    Target Date  10/19/19      PT LONG TERM GOAL #4   Title  Patient will describe pain no greater than 2/10 during ADL's, work, and following intercourse to demonstrate improved functional ability.    Baseline  7-8/10 on NPS at LBP, hips and vulva increased by above activities.    Time  10    Period  Weeks    Status  New    Target Date  10/19/19            Plan - 10/20/19 1108    Clinical Impression Statement  Pt. Responded well to all interventions today, demonstrating decreased spasm and TTP through areas of the PFM that were treated without increased pain. Discussed With the Pt. Paying attention to decide if this makes a significant impact on her apin/burning with urination and consult with her MD if she feels that it has made a large impact to determine if the procedure is still necessary.She demonstrated understanding and correct performance of all education and exercises provided today. She will continue to benefit from skilled physical therapy to work toward remaining goals and maximize function as well as decrease likelihood of symptom increase or recurrence.     Personal Factors and Comorbidities  Behavior Pattern;Comorbidity 2;Past/Current Experience;Time since onset of injury/illness/exacerbation    Comorbidities  anxiety/depression/generalized personality/disorder; headaches/migraines.    Examination-Activity Limitations  Sit;Stand;Toileting;Continence;Hygiene/Grooming    Examination-Participation Restrictions  Interpersonal Relationship;Community Activity    Stability/Clinical Decision Making   Unstable/Unpredictable    Rehab Potential  Good    PT Frequency  2x / week    PT Duration  --   10 weeks   PT Treatment/Interventions  ADLs/Self Care Home Management;Biofeedback;Electrical Stimulation;Moist Heat;Traction;Ultrasound;Stair training;Functional mobility training;Therapeutic activities;Gait training;Balance training;Therapeutic exercise;Neuromuscular re-education;Patient/family education;Manual techniques;Dry needling;Energy conservation;Spinal Manipulations;Joint Manipulations    PT Next Visit Plan  Further internal PFM TP release to posterior PR/PC/IC/coccygeus, re-check for LLD when pelvic alignment is improved;    PT Home Exercise Plan  diaphragmatic breathing in seated and hook-lying; side stretch (R>L); seated pelvic tilts; sleeping positions; R anterior innominate correction; bridges, supine and seated chin-tucks, child's pose with pillow under knees    Consulted and Agree with Plan of Care  Patient       Patient will benefit from skilled therapeutic intervention in order to improve the following deficits and impairments:  Abnormal gait, Decreased activity tolerance, Decreased balance, Decreased coordination, Decreased endurance, Decreased mobility, Decreased range of motion, Decreased strength, Increased muscle spasms, Increased fascial restricitons, Postural dysfunction, Improper body mechanics, Pain  Visit Diagnosis: Other muscle spasm  Mixed incontinence  Abnormal posture  Other abnormalities of gait and mobility     Problem List Patient Active Problem List   Diagnosis Date Noted  . Generalized abdominal pain   . Rectum inflammation   . Internal hemorrhoids   . Diverticulosis of small intestine without hemorrhage   . Nausea and vomiting   . Lower abdominal pain 01/29/2019  . Diarrhea 01/29/2019  . GAD (generalized anxiety disorder) 01/29/2019  . Severe episode of recurrent major depressive disorder, without psychotic features (HCC) 01/29/2019  . Panic  disorder with agoraphobia 06/19/2018   Cleophus Molt DPT, ATC Cleophus Molt 10/20/2019, 11:15  AM  [place] [facility] MAIN [facility] SERVICES [address] [place], [place], [address] Phone: [phone]   Fax:  [phone]  Name: Olivia Sullivan MRN: [id number] Date of Birth: 05-13-1996

## 2019-10-21 ENCOUNTER — Other Ambulatory Visit
Admission: RE | Admit: 2019-10-21 | Discharge: 2019-10-21 | Disposition: A | Discharge status: Home / Self Care | Payer: BC Managed Care – PPO | Source: Ambulatory Visit | Attending: Urology | Admitting: Urology

## 2019-10-21 DIAGNOSIS — Z01812 Encounter for preprocedural laboratory examination: Secondary | ICD-10-CM | POA: Insufficient documentation

## 2019-10-21 DIAGNOSIS — Z20822 Contact with and (suspected) exposure to covid-19: Secondary | ICD-10-CM | POA: Insufficient documentation

## 2019-10-21 LAB — SARS CORONAVIRUS 2 (TAT 6-24 HRS): SARS Coronavirus 2: NEGATIVE

## 2019-10-25 ENCOUNTER — Ambulatory Visit: Payer: BC Managed Care – PPO | Admitting: Anesthesiology

## 2019-10-25 ENCOUNTER — Encounter
Admission: RE | Disposition: A | Discharge status: Home / Self Care | Payer: Self-pay | Source: Home / Self Care | Attending: Urology

## 2019-10-25 ENCOUNTER — Ambulatory Visit
Admission: RE | Admit: 2019-10-25 | Discharge: 2019-10-25 | Disposition: A | Discharge status: Home / Self Care | Payer: BC Managed Care – PPO | Attending: Urology | Admitting: Urology

## 2019-10-25 ENCOUNTER — Telehealth: Payer: Self-pay | Admitting: Urology

## 2019-10-25 ENCOUNTER — Other Ambulatory Visit: Payer: Self-pay

## 2019-10-25 ENCOUNTER — Encounter: Payer: Self-pay | Admitting: Urology

## 2019-10-25 DIAGNOSIS — Z793 Long term (current) use of hormonal contraceptives: Secondary | ICD-10-CM | POA: Insufficient documentation

## 2019-10-25 DIAGNOSIS — F419 Anxiety disorder, unspecified: Secondary | ICD-10-CM | POA: Diagnosis not present

## 2019-10-25 DIAGNOSIS — F329 Major depressive disorder, single episode, unspecified: Secondary | ICD-10-CM | POA: Insufficient documentation

## 2019-10-25 DIAGNOSIS — N3010 Interstitial cystitis (chronic) without hematuria: Secondary | ICD-10-CM | POA: Insufficient documentation

## 2019-10-25 DIAGNOSIS — N3020 Other chronic cystitis without hematuria: Secondary | ICD-10-CM

## 2019-10-25 DIAGNOSIS — K589 Irritable bowel syndrome without diarrhea: Secondary | ICD-10-CM | POA: Insufficient documentation

## 2019-10-25 DIAGNOSIS — Z79899 Other long term (current) drug therapy: Secondary | ICD-10-CM | POA: Diagnosis not present

## 2019-10-25 HISTORY — PX: CYSTO WITH HYDRODISTENSION: SHX5453

## 2019-10-25 SURGERY — CYSTOSCOPY, WITH BLADDER HYDRODISTENSION
Anesthesia: General | Site: Bladder

## 2019-10-25 MED ORDER — ISOSULFAN BLUE 1 % ~~LOC~~ SOLN
SUBCUTANEOUS | Status: AC
  Filled 2019-10-25: qty 5

## 2019-10-25 MED ORDER — LIDOCAINE HCL URETHRAL/MUCOSAL 2 % EX GEL
CUTANEOUS | Status: AC
  Filled 2019-10-25: qty 10

## 2019-10-25 MED ORDER — SUCCINYLCHOLINE CHLORIDE 20 MG/ML IJ SOLN
INTRAMUSCULAR | Status: AC
  Filled 2019-10-25: qty 1

## 2019-10-25 MED ORDER — MIDAZOLAM HCL 2 MG/2ML IJ SOLN
INTRAMUSCULAR | Status: AC
  Administered 2019-10-25: 1 mg via INTRAVENOUS
  Filled 2019-10-25: qty 2

## 2019-10-25 MED ORDER — PROPOFOL 500 MG/50ML IV EMUL
INTRAVENOUS | Status: AC
  Filled 2019-10-25: qty 50

## 2019-10-25 MED ORDER — MIDAZOLAM HCL 5 MG/5ML IJ SOLN
INTRAMUSCULAR | Status: DC | PRN
  Administered 2019-10-25 (×2): 1 mg via INTRAVENOUS

## 2019-10-25 MED ORDER — MIDAZOLAM HCL 2 MG/2ML IJ SOLN
INTRAMUSCULAR | Status: AC
  Filled 2019-10-25: qty 2

## 2019-10-25 MED ORDER — FAMOTIDINE 20 MG PO TABS
ORAL_TABLET | ORAL | Status: AC
  Administered 2019-10-25: 20 mg via ORAL
  Filled 2019-10-25: qty 1

## 2019-10-25 MED ORDER — SCOPOLAMINE 1 MG/3DAYS TD PT72: 1.0000 | MEDICATED_PATCH | TRANSDERMAL | Status: DC

## 2019-10-25 MED ORDER — PROPOFOL 10 MG/ML IV BOLUS
INTRAVENOUS | Status: AC
  Filled 2019-10-25: qty 20

## 2019-10-25 MED ORDER — STERILE WATER FOR IRRIGATION IR SOLN
Status: DC | PRN
  Administered 2019-10-25: 1200 mL via INTRAVESICAL

## 2019-10-25 MED ORDER — LACTATED RINGERS IV SOLN: INTRAVENOUS | Status: DC

## 2019-10-25 MED ORDER — BELLADONNA ALKALOIDS-OPIUM 16.2-60 MG RE SUPP
RECTAL | Status: AC
  Filled 2019-10-25: qty 1

## 2019-10-25 MED ORDER — SCOPOLAMINE 1 MG/3DAYS TD PT72
MEDICATED_PATCH | TRANSDERMAL | Status: AC
  Administered 2019-10-25: 1.5 mg via TRANSDERMAL
  Filled 2019-10-25: qty 1

## 2019-10-25 MED ORDER — HYDROCODONE-ACETAMINOPHEN 5-325 MG PO TABS: 1.0000 | ORAL_TABLET | Freq: Four times a day (QID) | ORAL | 0 refills | Status: DC | PRN

## 2019-10-25 MED ORDER — CIPROFLOXACIN IN D5W 400 MG/200ML IV SOLN
400.0000 mg | INTRAVENOUS | Status: AC
  Administered 2019-10-25: 400 mg via INTRAVENOUS

## 2019-10-25 MED ORDER — MIDAZOLAM HCL 2 MG/2ML IJ SOLN: 1.0000 mg | Freq: Once | INTRAMUSCULAR | Status: AC

## 2019-10-25 MED ORDER — LIDOCAINE HCL (PF) 2 % IJ SOLN
INTRAMUSCULAR | Status: AC
  Filled 2019-10-25: qty 10

## 2019-10-25 MED ORDER — DEXAMETHASONE SODIUM PHOSPHATE 10 MG/ML IJ SOLN
INTRAMUSCULAR | Status: AC
  Filled 2019-10-25: qty 1

## 2019-10-25 MED ORDER — FENTANYL CITRATE (PF) 100 MCG/2ML IJ SOLN
INTRAMUSCULAR | Status: DC | PRN
  Administered 2019-10-25: 100 ug via INTRAVENOUS

## 2019-10-25 MED ORDER — FAMOTIDINE 20 MG PO TABS: 20.0000 mg | ORAL_TABLET | Freq: Once | ORAL | Status: AC

## 2019-10-25 MED ORDER — DEXMEDETOMIDINE HCL 200 MCG/2ML IV SOLN
INTRAVENOUS | Status: DC | PRN
  Administered 2019-10-25: 12 ug via INTRAVENOUS

## 2019-10-25 MED ORDER — ROCURONIUM BROMIDE 50 MG/5ML IV SOLN
INTRAVENOUS | Status: AC
  Filled 2019-10-25: qty 1

## 2019-10-25 MED ORDER — HYDROCODONE-ACETAMINOPHEN 5-325 MG PO TABS
ORAL_TABLET | ORAL | Status: AC
  Administered 2019-10-25: 1 via ORAL
  Filled 2019-10-25: qty 1

## 2019-10-25 MED ORDER — CIPROFLOXACIN HCL 250 MG PO TABS: 250.0000 mg | ORAL_TABLET | Freq: Two times a day (BID) | ORAL | 0 refills | Status: DC

## 2019-10-25 MED ORDER — HYDROCODONE-ACETAMINOPHEN 5-325 MG PO TABS: 1.0000 | ORAL_TABLET | Freq: Once | ORAL | Status: AC

## 2019-10-25 MED ORDER — ONDANSETRON HCL 4 MG/2ML IJ SOLN
INTRAMUSCULAR | Status: AC
  Filled 2019-10-25: qty 2

## 2019-10-25 MED ORDER — CIPROFLOXACIN IN D5W 400 MG/200ML IV SOLN
INTRAVENOUS | Status: AC
  Filled 2019-10-25: qty 200

## 2019-10-25 MED ORDER — FENTANYL CITRATE (PF) 100 MCG/2ML IJ SOLN
INTRAMUSCULAR | Status: AC
  Filled 2019-10-25: qty 2

## 2019-10-25 MED ORDER — GLYCOPYRROLATE 0.2 MG/ML IJ SOLN
INTRAMUSCULAR | Status: DC | PRN
  Administered 2019-10-25: .2 mg via INTRAVENOUS

## 2019-10-25 MED ORDER — PROPOFOL 10 MG/ML IV BOLUS
INTRAVENOUS | Status: DC | PRN
  Administered 2019-10-25 (×4): 20 mg via INTRAVENOUS

## 2019-10-25 SURGICAL SUPPLY — 11 items
BAG DRAIN CYSTO-URO LG1000N (MISCELLANEOUS) ×2 IMPLANT
CATH ROBINSON RED A/P 16FR (CATHETERS) ×2 IMPLANT
DRAPE UNDER BUTTOCK W/FLU (DRAPES) IMPLANT
GLOVE BIOGEL M STRL SZ7.5 (GLOVE) ×2 IMPLANT
GOWN STRL REUS W/TWL XL LVL3 (GOWN DISPOSABLE) ×2 IMPLANT
NEEDLE HYPO 25X1 1.5 SAFETY (NEEDLE) IMPLANT
PACK CYSTO AR (MISCELLANEOUS) ×2 IMPLANT
SET CYSTO W/LG BORE CLAMP LF (SET/KITS/TRAYS/PACK) ×2 IMPLANT
SURGILUBE 2OZ TUBE FLIPTOP (MISCELLANEOUS) ×2 IMPLANT
SYR 10ML LL (SYRINGE) ×2 IMPLANT
TUBING CONNECTING 10 (TUBING) ×2 IMPLANT

## 2019-10-25 NOTE — Anesthesia Postprocedure Evaluation (Signed)
Anesthesia Post Note  Patient: Laylana Marion  Procedure(s) Performed: CYSTOSCOPY/HYDRODISTENSION (N/A Bladder)  Patient location during evaluation: PACU Anesthesia Type: General Level of consciousness: awake and alert Pain management: pain level controlled Vital Signs Assessment: post-procedure vital signs reviewed and stable Respiratory status: spontaneous breathing, nonlabored ventilation, respiratory function stable and patient connected to nasal cannula oxygen Cardiovascular status: blood pressure returned to baseline and stable Postop Assessment: no apparent nausea or vomiting Anesthetic complications: no     Last Vitals:  Vitals:   10/25/19 1315 10/25/19 1330  BP: 108/75 (!) 102/59  Pulse: 95 72  Resp: 12 16  Temp: (!) 36.2 C   SpO2: 100% 100%    Last Pain:  Vitals:   10/25/19 1339  TempSrc:   PainSc: 4                  Yevette Edwards

## 2019-10-25 NOTE — Telephone Encounter (Signed)
Post op called and wanted a follow up appt for pt, they stated that she needs to keep her follow up but there is not one made for her. Please advise when you would like for her to follow up.

## 2019-10-25 NOTE — Progress Notes (Signed)
CV and Resp exam normal 

## 2019-10-25 NOTE — Transfer of Care (Signed)
Immediate Anesthesia Transfer of Care Note  Patient: Olivia Sullivan  Procedure(s) Performed: CYSTOSCOPY/HYDRODISTENSION (N/A Bladder)  Patient Location: PACU  Anesthesia Type:General  Level of Consciousness: awake, alert  and oriented  Airway & Oxygen Therapy: Patient Spontanous Breathing  Post-op Assessment: Report given to RN  Post vital signs: Reviewed and stable  Last Vitals:  Vitals Value Taken Time  BP    Temp    Pulse 95 10/25/19 1314  Resp 12 10/25/19 1314  SpO2 100 % 10/25/19 1314  Vitals shown include unvalidated device data.  Last Pain:  Vitals:   10/25/19 1113  TempSrc: Tympanic  PainSc: 0-No pain         Complications: No apparent anesthesia complications

## 2019-10-25 NOTE — Discharge Instructions (Signed)
I have reviewed discharge instructions in detail with the patient. They will follow-up with me or their physician as scheduled. My nurse will also be calling the patients as per protocol.   AMBULATORY SURGERY  DISCHARGE INSTRUCTIONS   1) The drugs that you were given will stay in your system until tomorrow so for the next 24 hours you should not:  A) Drive an automobile B) Make any legal decisions C) Drink any alcoholic beverage   2) You may resume regular meals tomorrow.  Today it is better to start with liquids and gradually work up to solid foods.  You may eat anything you prefer, but it is better to start with liquids, then soup and crackers, and gradually work up to solid foods.   3) Please notify your doctor immediately if you have any unusual bleeding, trouble breathing, redness and pain at the surgery site, drainage, fever, or pain not relieved by medication.   Please contact your physician with any problems or Same Day Surgery at [phone], Monday through Friday 6 am to 4 pm, or [place] at [facility] number at [phone].   Hydrodistention of the Bladder, Care After This sheet gives you information about how to care for yourself after your procedure. Your health care provider may also give you more specific instructions. If you have problems or questions, contact your health care provider. What can I expect after the procedure? After the procedure, it is common to have:  Soreness and mild discomfort in your lower abdomen.  Mild pain when you urinate. Pain should stop within a few minutes after you urinate. This may last for up to a week.  A small amount of blood in your urine. Follow these instructions at home: Medicines  Take over-the-counter and prescription medicines only as told by your health care provider.  Do not drive for 24 hours if you were given a sedative during your procedure.  Do not drive or use heavy machinery while taking prescription  pain medicine.  If you were prescribed an antibiotic medicine, take it as told by your health care provider. Do not stop taking the antibiotic even if you start to feel better. Lifestyle  Do not use any products that contain nicotine or tobacco, such as cigarettes, e-cigarettes, and chewing tobacco. If you need help quitting, ask your health care provider. Activity  Return to your normal activities as told by your health care provider. Ask your health care provider what activities are safe for you.  Do not lift anything that is heavier than 10 lb (4.5 kg), or the limit that you are told, until your health care provider says that it is safe. Eating and drinking   Follow instructions from your health care provider about eating or drinking restrictions.  Drink enough fluid to keep your urine pale yellow. General instructions  If a sample was removed for testing (biopsy), it is your responsibility to get your test results. Ask your health care provider or the department doing the test when your results will be ready.  Keep all follow-up visits as told by your health care provider. This is important. Contact a health care provider if you have:  Blood clots in your urine.  Pus in your urine.  Pain that gets worse or does not get better with medicine, especially pain when you urinate.  Difficulty urinating.  Nausea or you vomit for more than 2 days after the procedure.  A fever. Get help right away if you:  Have severe  pain in your abdomen.  Cannot urinate.  Have chest pain or difficulty breathing.  Develop swelling or pain (or both) in your lower legs. Summary  After this procedure, it is common to have soreness in your lower abdomen, mild pain when you urinate, and a small amount of blood in your urine.  Return to your normal activities as told by your health care provider.  Contact a health care provider if you have pain that gets worse, have a fever, see blood clots in  your urine, or have difficulty urinating.  Get help right away if you have severe pain in your abdomen, cannot urinate, have difficulty breathing, or develop swelling and pain in the legs. This information is not intended to replace advice given to you by your health care provider. Make sure you discuss any questions you have with your health care provider. Document Revised: 01/14/2019 Document Reviewed: 04/02/2018 Elsevier Patient Education  2020 [employer].

## 2019-10-25 NOTE — Op Note (Signed)
Preoperative diagnosis: Chronic cystitis Postoperative diagnosis: Chronic interstitial cystitis Surgery: Cystoscopy and bladder hydrodistention Surgeon: Dr. Lorin Picket Donaldo Teegarden  The patient has the above diagnosis and consented the above procedure.  Preoperative antibiotics given.  Extra care was taken with leg positioning  21 French cystoscope was utilized.  Bladder mucosa and trigone were normal.  No stitch foreign body or carcinoma.  Hydrodistended for a number of minutes to 500 mL.  Bladder was emptied.  On reinspection she had very diffuse glomerulations that were exceptionally impressive throughout the entire bladder.  No ulcers.  No bladder injury.  Bladder was empty.  Patient was taken to recovery  Patient will be treated for interstitial cystitis

## 2019-10-25 NOTE — Telephone Encounter (Signed)
Two weeks good for post ops

## 2019-10-25 NOTE — Progress Notes (Signed)
Dr. Caryl Pina stated he has given the family instructions and that they understand therefore post op does not have any instructions to discuss with family except the anesthesia instructions.

## 2019-10-25 NOTE — OR Nursing (Signed)
POCT urine pregnancy is negative.

## 2019-10-25 NOTE — Interval H&P Note (Signed)
History and Physical Interval Note:  10/25/2019 11:26 AM  Olivia Sullivan  has presented today for surgery, with the diagnosis of Chronic Cystitis.  The various methods of treatment have been discussed with the patient and family. After consideration of risks, benefits and other options for treatment, the patient has consented to  Procedure(s): CYSTOSCOPY/HYDRODISTENSION (N/A) as a surgical intervention.  The patient's history has been reviewed, patient examined, no change in status, stable for surgery.  I have reviewed the patient's chart and labs.  Questions were answered to the patient's satisfaction.     Amirr Achord A Tameem Pullara

## 2019-10-25 NOTE — H&P (Signed)
HPI:  I was consulted to assess the patient's chronic history of dysuria and frequency. She can void every 30 minutes and cannot hold it for 2 hours. She has no nocturia. She is a reasonable flow but does not necessarily feel empty  She has uncommon urge and stress incontinence but does not wear a pad.  She describes negative urine cultures and having this problem for many years. She even had issues as a child. She has dyspareunia. She does not get a lot of suprapubic discomfort. The burning is near the urethra and vaginal opening when she urinates.  She has a history of irritable bowel syndrome  On pelvic examination the patient was a bit nervous. She had excellent bladder support. No diverticulum. Levator muscles a bit tender as was her bladder.  Patient has significant frequency chronic burning and mild mixed incontinence. She may have interstitial cystitis. She may need a hydrodistention in the future.  She was quite tender but also nervous that may have an element of vulvodynia. My index suspicion is moderately high that she has interstitial cystitis. I mentioned a bladder hydrodistention briefly today as well. She understands the concept of an inflamed bladder. Next visit I will also ask her what symptom or sensation is making her void every 30 minutes  Today  Frequency stable. Dysuria stable. On urodynamics patient did not void and was catheterized for a few milliliters. Maximum bladder capacity 230 mL. She had some cramping in the lower abdomen during bladder filling. Her bladder was hypersensitive especially at low bladder volumes. Bladder was unstable reaching a pressure of 5 cm of water. She did not leak. No stress incontinence with a Valsalva pressure of 85 cm water. Near the end of her voluntary void she had a spiking pressure and rated pain as a 10 out of 10. It reduced to 8 out of 10 after voiding. She had urethral burning and cramping. She voided 220 mL with a maximum flow 20 mils per  second. Maximum voiding pressure 39 cm water. EMG activity increased during voiding. Bladder neck descent at 1 cm. The voiding spiking pressure in my opinion was a terminal post void involuntary contraction no evidence of neurogenic bladder dysfunction  Test highly suggest interstitial cystitis. Hydrodistention discussed. Usual template described  PMH:      Past Medical History:  Diagnosis Date  . Anxiety   . Depression   . Migraine    Surgical History:       Past Surgical History:  Procedure Laterality Date  . COLONOSCOPY WITH PROPOFOL N/A 03/11/2019   Procedure: COLONOSCOPY WITH PROPOFOL; Surgeon: [name], MD; Location: [facility]; Service: Endoscopy; Laterality: N/A;  . ESOPHAGOGASTRODUODENOSCOPY (EGD) WITH PROPOFOL N/A 03/11/2019   Procedure: ESOPHAGOGASTRODUODENOSCOPY (EGD) WITH PROPOFOL; Surgeon: [name], MD; Location: [facility]; Service: Endoscopy; Laterality: N/A;  . NO PAST SURGERIES     Home Medications:  Allergies as of 10/04/2019   No Known Allergies          Medication List       Accurate as of October 04, 2019 2:38 PM. If you have any questions, ask your nurse or doctor.        diazepam 5 MG tablet  Commonly known as: VALIUM  Insert 5-10 mg (1 to 2 tablets) vaginal nightly for one (1) month then insert vaginal three (3) times per week as needed for a total of 16 weeks. Refills require office visit.   norgestimate-ethinyl estradiol 0.25-35 MG-MCG tablet  Commonly known as:  ORTHO-CYCLEN  Take 1 tablet by mouth daily.       Allergies: No Known Allergies  Family History:       Family History  Problem Relation Age of Onset  . Diabetes Father   . Depression Father   . Anxiety disorder Father   . Depression Sister   . Anxiety disorder Sister   . Irritable bowel syndrome Sister   . Depression Brother   . Anxiety disorder Brother   . Irritable bowel syndrome Brother   . Diabetes Maternal Grandmother   . Endometriosis  Maternal Grandmother   . Endometriosis Maternal Aunt   . Breast cancer Neg Hx   . Ovarian cancer Neg Hx   . Colon cancer Neg Hx    Social History: reports that she has never smoked. She has never used smokeless tobacco. She reports current alcohol use. She reports that she does not use drugs.  ROS:  UROLOGY  Frequent Urination?: Yes  Hard to postpone urination?: Yes  Burning/pain with urination?: Yes  Get up at night to urinate?: No  Leakage of urine?: Yes  Urine stream starts and stops?: No  Trouble starting stream?: No  Do you have to strain to urinate?: No  Blood in urine?: No  Urinary tract infection?: No  Sexually transmitted disease?: No  Injury to kidneys or bladder?: No  Painful intercourse?: No  Weak stream?: No  Currently pregnant?: No  Vaginal bleeding?: No  Last menstrual period?: N  Gastrointestinal  Nausea?: No  Vomiting?: No  Indigestion/heartburn?: No  Diarrhea?: No  Constipation?: No  Constitutional  Fever: No  Night sweats?: No  Weight loss?: No  Fatigue?: No  Skin  Skin rash/lesions?: No  Itching?: No  Eyes  Blurred vision?: No  Double vision?: No  Ears/Nose/Throat  Sore throat?: No  Sinus problems?: No  Hematologic/Lymphatic  Swollen glands?: No  Easy bruising?: No  Cardiovascular  Leg swelling?: No  Chest pain?: No  Respiratory  Cough?: No  Shortness of breath?: No  Endocrine  Excessive thirst?: No  Musculoskeletal  Back pain?: No  Joint pain?: No  Neurological  Headaches?: No  Dizziness?: No  Psychologic  Depression?: No  Anxiety?: No  Physical Exam:  BP 124/84  Pulse (!) 106  Ht 5\' 5"  (1.651 m)  Wt 70.3 kg  LMP 09/20/2019  BMI 25.79 kg/m  Constitutional: Alert and oriented, No acute distress.  Laboratory Data:  Recent Labs                                               Recent Labs                       Recent Labs     Recent Labs     Recent Labs                      Urinalysis  Labs (Brief)                                                            Pertinent Imaging:  Assessment & Plan: Proceed with hydrodistention. My index of suspicion is high  she has interstitial cystitis. 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 We have discussed the potential benefits and risks of the procedure, side effects of the proposed treatment, the likelihood of the patient achieving the goals of the procedure, and any potential problems that might occur during the procedure or recuperation. Informed consent has been obtained.

## 2019-10-25 NOTE — Anesthesia Preprocedure Evaluation (Addendum)
Anesthesia Evaluation  Patient identified by MRN, date of birth, ID band Patient awake    Reviewed: Allergy & Precautions, H&P , NPO status , Patient's Chart, lab work & pertinent test results  Airway Mallampati: II  TM Distance: >3 FB     Dental  (+) Teeth Intact   Pulmonary neg pulmonary ROS,           Cardiovascular negative cardio ROS       Neuro/Psych  Headaches, PSYCHIATRIC DISORDERS Anxiety Depression    GI/Hepatic negative GI ROS, Neg liver ROS,   Endo/Other  negative endocrine ROS  Renal/GU negative Renal ROS  negative genitourinary   Musculoskeletal   Abdominal   Peds  Hematology negative hematology ROS (+)   Anesthesia Other Findings Past Medical History: No date: Anxiety No date: Depression No date: Migraine  Past Surgical History: 03/11/2019: COLONOSCOPY WITH PROPOFOL; N/A     Comment:  Procedure: COLONOSCOPY WITH PROPOFOL;  Surgeon:               [name], MD;  Location: [facility];                Service: Endoscopy;  Laterality: N/A; 03/11/2019: ESOPHAGOGASTRODUODENOSCOPY (EGD) WITH PROPOFOL; N/A     Comment:  Procedure: ESOPHAGOGASTRODUODENOSCOPY (EGD) WITH               PROPOFOL;  Surgeon: [name], MD;  Location:               [facility];  Service: Endoscopy;  Laterality: N/A; No date: NO PAST SURGERIES     Reproductive/Obstetrics negative OB ROS                            Anesthesia Physical Anesthesia Plan  ASA: II  Anesthesia Plan: General   Post-op Pain Management:    Induction:   PONV Risk Score and Plan:   Airway Management Planned:   Additional Equipment:   Intra-op Plan:   Post-operative Plan:   Informed Consent: I have reviewed the patients History and Physical, chart, labs and discussed the procedure including the risks, benefits and alternatives for the proposed anesthesia with the patient or authorized  representative who has indicated his/her understanding and acceptance.     Dental Advisory Given  Plan Discussed with: Anesthesiologist, CRNA and Surgeon  Anesthesia Plan Comments:         Anesthesia Quick Evaluation

## 2019-10-27 ENCOUNTER — Other Ambulatory Visit: Payer: Self-pay

## 2019-10-27 ENCOUNTER — Ambulatory Visit (INDEPENDENT_AMBULATORY_CARE_PROVIDER_SITE_OTHER): Payer: BC Managed Care – PPO | Admitting: Urology

## 2019-10-27 ENCOUNTER — Encounter: Payer: Self-pay | Admitting: Urology

## 2019-10-27 ENCOUNTER — Telehealth: Payer: Self-pay | Admitting: Radiology

## 2019-10-27 VITALS — BP 119/77 | HR 89 | Ht 65.0 in | Wt 155.0 lb

## 2019-10-27 DIAGNOSIS — G8918 Other acute postprocedural pain: Secondary | ICD-10-CM | POA: Diagnosis not present

## 2019-10-27 LAB — URINALYSIS, COMPLETE
Bilirubin, UA: NEGATIVE
Glucose, UA: NEGATIVE
Ketones, UA: NEGATIVE
Leukocytes,UA: NEGATIVE
Nitrite, UA: NEGATIVE
Protein,UA: NEGATIVE
Specific Gravity, UA: 1.02 (ref 1.005–1.030)
Urobilinogen, Ur: 0.2 mg/dL (ref 0.2–1.0)
pH, UA: 7 (ref 5.0–7.5)

## 2019-10-27 LAB — BLADDER SCAN AMB NON-IMAGING: Scan Result: 6

## 2019-10-27 LAB — MICROSCOPIC EXAMINATION
Bacteria, UA: NONE SEEN
RBC, Urine: NONE SEEN /hpf (ref 0–2)

## 2019-10-27 MED ORDER — MIRABEGRON ER 25 MG PO TB24: 25.0000 mg | ORAL_TABLET | Freq: Every day | ORAL | 0 refills | Status: DC

## 2019-10-27 MED ORDER — URIBEL 118 MG PO CAPS: 1.0000 | ORAL_CAPSULE | Freq: Four times a day (QID) | ORAL | 3 refills | Status: DC | PRN

## 2019-10-27 MED ORDER — HYDROCODONE-ACETAMINOPHEN 5-325 MG PO TABS: 1.0000 | ORAL_TABLET | Freq: Four times a day (QID) | ORAL | 0 refills | Status: DC | PRN

## 2019-10-27 NOTE — Telephone Encounter (Signed)
Appointment made for patient in clinic today per Michiel Cowboy.

## 2019-10-27 NOTE — Progress Notes (Signed)
10/27/2019 10:47 PM   Olivia Sullivan [birth year] [id number]  Referring provider: Doren Custard, FNP [address] [place],  [place] [address]  Chief Complaint  Patient presents with  . Post-op Problem    HPI: Olivia Sullivan is a 24 year old female who underwent a cystoscopy and bladder hydro distention on 10/25/2019 with Dr. Sherron Monday who presents today for increasing pain, cramping and nausea.   She is experiencing pain with urination but starts in the urethra and radiates up the suprapubic region to the epigastric region.  She also has a cramping sensation to her lower abdominal region.  She is also developed some dizziness this morning.  She describes the pain as having intense menstrual cramps.  Patient denies any modifying or aggravating factors.  Patient denies any gross hematuria, dysuria or suprapubic/flank pain.  Patient denies any fevers, chills, nausea or vomiting.   Her UA is benign.  Her PVR is 6 mL.    PMH: Past Medical History:  Diagnosis Date  . Anxiety   . Depression   . Migraine     Surgical History: Past Surgical History:  Procedure Laterality Date  . COLONOSCOPY WITH PROPOFOL N/A 03/11/2019   Procedure: COLONOSCOPY WITH PROPOFOL;  Surgeon: [name], MD;  Location: [facility];  Service: Endoscopy;  Laterality: N/A;  . CYSTO WITH HYDRODISTENSION N/A 10/25/2019   Procedure: CYSTOSCOPY/HYDRODISTENSION;  Surgeon: Alfredo Martinez, MD;  Location: [facility];  Service: Urology;  Laterality: N/A;  . ESOPHAGOGASTRODUODENOSCOPY (EGD) WITH PROPOFOL N/A 03/11/2019   Procedure: ESOPHAGOGASTRODUODENOSCOPY (EGD) WITH PROPOFOL;  Surgeon: [name], MD;  Location: [facility];  Service: Endoscopy;  Laterality: N/A;  . NO PAST SURGERIES      Home Medications:  Allergies as of 10/27/2019      Reactions   Latex Rash      Medication List       Accurate as of October 27, 2019 10:47 PM. If you have any questions, ask your nurse or doctor.          ciprofloxacin 250 MG tablet Commonly known as: Cipro Take 1 tablet (250 mg total) by mouth 2 (two) times daily.   doxycycline 100 MG capsule Commonly known as: VIBRAMYCIN Take 100 mg by mouth 2 (two) times daily.   HYDROcodone-acetaminophen 5-325 MG tablet Commonly known as: Norco Take 1-2 tablets by mouth every 6 (six) hours as needed for moderate pain.   mirabegron ER 25 MG Tb24 tablet Commonly known as: MYRBETRIQ Take 1 tablet (25 mg total) by mouth daily. Started by: Michiel Cowboy, PA-C   NORETHINDRONE PO Take by mouth.   PROBIOTIC DAILY PO Take 1 capsule by mouth daily.   Uribel 118 MG Caps Take 1 capsule (118 mg total) by mouth every 6 (six) hours as needed. Started by: Michiel Cowboy, PA-C       Allergies:  Allergies  Allergen Reactions  . Latex Rash    Family History: Family History  Problem Relation Age of Onset  . Diabetes Father   . Depression Father   . Anxiety disorder Father   . Depression Sister   . Anxiety disorder Sister   . Irritable bowel syndrome Sister   . Depression Brother   . Anxiety disorder Brother   . Irritable bowel syndrome Brother   . Diabetes Maternal Grandmother   . Endometriosis Maternal Grandmother   . Endometriosis Maternal Aunt   . Breast cancer Neg Hx   . Ovarian cancer Neg Hx   . Colon cancer  Neg Hx     Social History:  reports that she has never smoked. She has never used smokeless tobacco. She reports current alcohol use. She reports that she does not use drugs.  ROS: UROLOGY Frequent Urination?: No Hard to postpone urination?: No Burning/pain with urination?: Yes Get up at night to urinate?: No Leakage of urine?: No Urine stream starts and stops?: Yes Trouble starting stream?: No Do you have to strain to urinate?: No Blood in urine?: Yes Urinary tract infection?: No Sexually transmitted disease?: No Injury to kidneys or bladder?: No Painful intercourse?: No Weak stream?: No Currently  pregnant?: No Vaginal bleeding?: No Last menstrual period?: n  Gastrointestinal Nausea?: Yes Vomiting?: Yes Indigestion/heartburn?: No Diarrhea?: No Constipation?: Yes  Constitutional Fever: No Night sweats?: No Weight loss?: No Fatigue?: No  Skin Skin rash/lesions?: No Itching?: Yes  Eyes Blurred vision?: Yes Double vision?: No  Ears/Nose/Throat Sore throat?: No Sinus problems?: No  Hematologic/Lymphatic Swollen glands?: No Easy bruising?: No  Cardiovascular Leg swelling?: No Chest pain?: No  Respiratory Cough?: No Shortness of breath?: No  Endocrine Excessive thirst?: No  Musculoskeletal Back pain?: Yes Joint pain?: No  Neurological Headaches?: Yes Dizziness?: Yes  Psychologic Depression?: Yes Anxiety?: Yes  Physical Exam: BP 119/77   Pulse 89   Ht 5\' 5"  (1.651 m)   Wt 155 lb (70.3 kg)   BMI 25.79 kg/m  Constitutional:  Well nourished. Alert and oriented, No acute distress. HEENT: [place] AT, mask in place.  Trachea midline, no masses. Cardiovascular: No clubbing, cyanosis, or edema. Respiratory: Normal respiratory effort, no increased work of breathing. Neurologic: Grossly intact, no focal deficits, moving all 4 extremities. Psychiatric: Normal mood and affect.  Laboratory Data: Lab Results  Component Value Date   WBC 9.0 05/17/2019   HGB 13.4 05/17/2019   HCT 40.5 05/17/2019   MCV 84.2 05/17/2019   PLT 384 05/17/2019    Lab Results  Component Value Date   CREATININE 0.74 05/17/2019    No results found for: PSA  No results found for: TESTOSTERONE  Lab Results  Component Value Date   HGBA1C 4.9 05/19/2019    Lab Results  Component Value Date   TSH 2.958 05/17/2019    No results found for: CHOL, HDL, CHOLHDL, VLDL, LDLCALC  Lab Results  Component Value Date   AST 26 05/17/2019   Lab Results  Component Value Date   ALT 20 05/17/2019   No components found for: ALKALINEPHOPHATASE No components found for:  BILIRUBINTOTAL  No results found for: ESTRADIOL  Urinalysis Component     Latest Ref Rng & Units 10/27/2019  Specific Gravity, UA     1.005 - 1.030 1.020  pH, UA     5.0 - 7.5 7.0  Color, UA     Yellow Yellow  Appearance Ur     Clear Clear  Leukocytes,UA     Negative Negative  Protein,UA     Negative/Trace Negative  Glucose, UA     Negative Negative  Ketones, UA     Negative Negative  RBC, UA     Negative 1+ (A)  Bilirubin, UA     Negative Negative  Urobilinogen, Ur     0.2 - 1.0 mg/dL 0.2  Nitrite, UA     Negative Negative  Microscopic Examination      See below:   Component     Latest Ref Rng & Units 10/27/2019  WBC, UA     0 - 5 /hpf 0-5  RBC  0 - 2 /hpf None seen  Epithelial Cells (non renal)     0 - 10 /hpf 0-10  Bacteria, UA     None seen/Few None seen    I have reviewed the labs.   Pertinent Imaging: Results for JACQULYN, BARRESI (MRN [id number]) as of 10/27/2019 22:43  Ref. Range 10/27/2019 14:31  Scan Result Unknown 6     Assessment & Plan:    1. Post-op pain - Reassured patient that this is sometimes normal after hydrodistention and to give it more time - Urinalysis, Complete negative - Bladder Scan (Post Void Residual) in office - minimal  - Given a prescription for Uribel to take every 6 hours as needed, #20 - Given Myrbetriq 25 mg daily, #14 samples - Refilled Norco prescription after reviewing PDMP - Keep appointment with Dr. Matilde Sprang on 02/08   Return for Keep appointment with DR. MacDiarmid.  These notes generated with voice recognition software. I apologize for typographical errors.  Zara Council, PA-C  [facility] [address]  [place], [place] [address] [phone]

## 2019-10-27 NOTE — Patient Instructions (Signed)
Interstitial Cystitis  Interstitial cystitis is inflammation of the bladder. This may cause pain in the bladder area as well as a frequent and urgent need to urinate. The bladder is a hollow organ in the lower part of the abdomen. It stores urine after the urine is made in the kidneys. The severity of interstitial cystitis can vary from person to person. You may have flare-ups, and then your symptoms may go away for a while. For many people, it becomes a long-term (chronic) problem. What are the causes? The cause of this condition is not known. What increases the risk? The following factors may make you more likely to develop this condition:  You are female.  You have fibromyalgia.  You have irritable bowel syndrome (IBS).  You have endometriosis. This condition may be aggravated by:  Stress.  Smoking.  Spicy foods. What are the signs or symptoms? Symptoms of interstitial cystitis vary, and they can change over time. Symptoms may include:  Discomfort or pain in the bladder area, which is in the lower abdomen. Pain can range from mild to severe. The pain may change in intensity as the bladder fills with urine or as it empties.  Pain in the pelvic area, between the hip bones.  An urgent need to urinate.  Frequent urination.  Pain during urination.  Pain during sex.  Blood in the urine. For women, symptoms often get worse during menstruation. How is this diagnosed? This condition is diagnosed based on your symptoms, your medical history, and a physical exam. You may have tests to rule out other conditions, such as:  Urine tests.  Cystoscopy. For this test, a tool similar to a very thin telescope is used to look into your bladder.  Biopsy. This involves taking a sample of tissue from the bladder to be examined under a microscope. How is this treated? There is no cure for this condition, but treatment can help you control your symptoms. Work closely with your health care  provider to find the most effective treatments for you. Treatment options may include:  Medicines to relieve pain and reduce how often you feel the need to urinate.  Learning ways to control when you urinate (bladder training).  Lifestyle changes, such as changing your diet or taking steps to control stress.  Using a device that provides electrical stimulation to your nerves, which can relieve pain (neuromodulation therapy). The device is placed on your back, where it blocks the nerves that cause you to feel pain in your bladder area.  A procedure that stretches your bladder by filling it with air or fluid.  Surgery. This is rare. It is only done for extreme cases, if other treatments do not help. Follow these instructions at home: Bladder training   Use bladder training techniques as directed. Techniques may include: ? Urinating at scheduled times. ? Training yourself to delay urination. ? Doing exercises (Kegel exercises) to strengthen the muscles that control urine flow.  Keep a bladder diary. ? Write down the times that you urinate and any symptoms that you have. This can help you find out which foods, liquids, or activities make your symptoms worse. ? Use your bladder diary to schedule bathroom trips. If you are away from home, plan to be near a bathroom at each of your scheduled times.  Make sure that you urinate just before you leave the house and just before you go to bed. Eating and drinking  Make dietary changes as recommended by your health care provider. You   may need to avoid: ? Spicy foods. ? Foods that contain a lot of potassium.  Limit your intake of beverages that make you need to urinate. These include: ? Caffeinated beverages like soda, coffee, and tea. ? Alcohol. General instructions  Take over-the-counter and prescription medicines only as told by your health care provider.  Do not drink alcohol.  You can try a warm or cool compress over your bladder for  comfort.  Avoid wearing tight clothing.  Do not use any products that contain nicotine or tobacco, such as cigarettes and e-cigarettes. If you need help quitting, ask your health care provider.  Keep all follow-up visits as told by your health care provider. This is important. Contact a health care provider if you have:  Symptoms that do not get better with treatment.  Pain or discomfort that gets worse.  More frequent urges to urinate.  A fever. Get help right away if:  You have no control over when you urinate. Summary  Interstitial cystitis is inflammation of the bladder.  This condition may cause pain in the bladder area as well as a frequent and urgent need to urinate.  You may have flare-ups of the condition, and then it may go away for a while. For many people, it becomes a long-term (chronic) problem.  There is no cure for interstitial cystitis, but treatment methods are available to control your symptoms. This information is not intended to replace advice given to you by your health care provider. Make sure you discuss any questions you have with your health care provider. Document Revised: 09/05/2017 Document Reviewed: 08/18/2017 Elsevier Patient Education  2020 Elsevier Inc.   Eating Plan for Interstitial Cystitis Interstitial cystitis (IC) is a long-term (chronic) condition that causes pain and pressure in the bladder, the lower abdomen, and the pelvic area. Other symptoms of IC include urinary urgency and frequency. Symptoms tend to come and go. Many people with IC find that certain foods trigger their symptoms. Different foods may be problematic for different people. Some foods are more likely to cause symptoms than others. Learning which foods bother you and which do not can help you come up with an eating plan to manage IC. What are tips for following this plan? You may find it helpful to work with a dietitian. This health care provider can help you develop your  eating plan by doing an elimination diet, which involves these steps:  Start with a list of foods that you think trigger your IC symptoms along with the foods that most commonly trigger symptoms for many people with IC.  Eliminate those foods from your diet for about one month, then start reintroducing the foods one at a time to see which ones trigger your symptoms.  Make a list of the foods that trigger your symptoms. It may take several months to find out which foods bother you. Reading food labels Once you know which foods trigger your IC symptoms, you can avoid them. However, it is also a good idea to read food labels because some foods that trigger your symptoms may be included as ingredients in other foods. These ingredients may include:  Chili peppers.  Tomato products.  Soy.  Worcestershire sauce.  Vinegar.  Alcohol.  Citrus flavors or juices.  Artificial sweeteners.  Monosodium glutamate. Shopping  Shopping can be a challenge if many foods trigger your IC. When you go grocery shopping, bring a list of the foods you can eat.  You can get an app for your   phone that lets you know which foods are the safest and which you may want to avoid. You can find the app at the Interstitial Cystitis Network website: www.ic-network.com Meal planning  Plan your meals according to the results of your elimination diet. If you have not done an elimination diet, plan meals according to IC food lists recommended by your health care provider or dietitian. These lists tell you which foods are least and most likely to cause symptoms.  Avoid certain types of food when you go out to eat, such as pizza and foods typically served at Indian, Mexican, and Thai restaurants. These foods often contain ingredients that can aggravate IC. General information Here are some general guidelines for an IC eating plan:  Do not eat large portions.  Drink plenty of fluids with your meals.  Do not eat foods  that are high in sugar, salt, or saturated fat.  Choose whole fruits instead of juice.  Eat a colorful variety of vegetables. What foods should I eat? For people with IC, the best diet is a balanced one that includes things from all the food groups. Even if you have to avoid certain foods, there are still plenty of healthy choices in each group. The following are some foods that are least bothersome and may be safest to eat: Fruits Bananas. Blueberries and blueberry juice. Melons. Pears. Apples. Dates. Prunes. Raisins. Apricots. Vegetables Asparagus. Avocado. Celery. Beets. Bell peppers. Black olives. Broccoli. Brussels sprouts. Cabbage. Carrots. Cauliflower. Cucumber. Eggplant. Green beans. Potatoes. Radishes. Spinach. Squash. Turnips. Zucchini. Mushrooms. Peas. Grains Oats. Rice. Bran. Oatmeal. Whole wheat bread. Meats and other proteins Beef. Fish and other seafood. Eggs. Nuts. Peanut butter. Pork. Poultry. Lamb. Garbanzo beans. Pinto beans. Dairy Whole or low-fat milk. American, mozzarella, mild cheddar, feta, ricotta, and cream cheeses. The items listed above may not be a complete list of foods and beverages you can eat. Contact a dietitian for more information. What foods should I avoid? You should avoid any foods that seem to trigger your symptoms. It is also a good idea to avoid foods that are most likely to cause symptoms in many people with IC. These include the following: Fruits Citrus fruits, including lemons, limes, oranges, and grapefruit. Cranberries. Strawberries. Pineapple. Kiwi. Vegetables Chili peppers. Onions. Sauerkraut. Tomato and tomato products. Pickles. Grains You do not need to avoid any type of grain unless it triggers your symptoms. Meats and other proteins Precooked or cured meats, such as sausages or meat loaves. Soy products. Dairy Chocolate ice cream. Processed cheese. Yogurt. Beverages Alcohol. Chocolate drinks. Coffee. Cranberry juice. Carbonated  drinks. Tea (black, green, or herbal). Tomato juice. Sports drinks. The items listed above may not be a complete list of foods and beverages you should avoid. Contact a dietitian for more information. Summary  Many people with IC find that certain foods trigger their symptoms. Different foods may be problematic for different people. Some foods are more likely to cause symptoms than others.  You may find it helpful to work with a dietitian to do an elimination diet and come up with an eating plan that is right for you.  Plan your meals according to the results of your elimination diet. If you have not done an elimination diet, plan your meals using IC food lists. These lists tell you which foods are least and most likely to cause symptoms.  The best diet for people with IC is a balanced diet that includes foods from all the food groups. Even if you have   to avoid certain foods, there are still plenty of healthy choices in each group. This information is not intended to replace advice given to you by your health care provider. Make sure you discuss any questions you have with your health care provider. Document Revised: 01/14/2019 Document Reviewed: 05/28/2018 Elsevier Patient Education  2020 Elsevier Inc.  

## 2019-10-27 NOTE — Telephone Encounter (Signed)
Patient states that after having a hydrodistention Monday she is having increasing pain, cramping, and nausea. Please advise.

## 2019-10-28 ENCOUNTER — Encounter: Payer: Self-pay | Admitting: Family Medicine

## 2019-10-28 ENCOUNTER — Telehealth: Payer: Self-pay | Admitting: Urology

## 2019-10-28 NOTE — Telephone Encounter (Signed)
Letter sent on Mychart

## 2019-10-28 NOTE — Telephone Encounter (Signed)
That is fine to give her a work note for one week.

## 2019-10-28 NOTE — Telephone Encounter (Signed)
Pt called and states that she needs a Dr note to for her work, she has still not been able to return to work since surgery.

## 2019-11-02 ENCOUNTER — Ambulatory Visit: Payer: BC Managed Care – PPO

## 2019-11-15 ENCOUNTER — Encounter: Payer: Self-pay | Admitting: Urology

## 2019-11-15 ENCOUNTER — Ambulatory Visit (INDEPENDENT_AMBULATORY_CARE_PROVIDER_SITE_OTHER): Payer: BC Managed Care – PPO | Admitting: Urology

## 2019-11-15 ENCOUNTER — Other Ambulatory Visit: Payer: Self-pay

## 2019-11-15 VITALS — BP 127/76 | HR 95 | Ht 65.0 in | Wt 160.0 lb

## 2019-11-15 DIAGNOSIS — R300 Dysuria: Secondary | ICD-10-CM | POA: Diagnosis not present

## 2019-11-15 DIAGNOSIS — N3020 Other chronic cystitis without hematuria: Secondary | ICD-10-CM | POA: Diagnosis not present

## 2019-11-15 LAB — MICROSCOPIC EXAMINATION
Bacteria, UA: NONE SEEN
RBC, Urine: NONE SEEN /hpf (ref 0–2)

## 2019-11-15 LAB — URINALYSIS, COMPLETE
Bilirubin, UA: NEGATIVE
Glucose, UA: NEGATIVE
Ketones, UA: NEGATIVE
Leukocytes,UA: NEGATIVE
Nitrite, UA: NEGATIVE
Protein,UA: NEGATIVE
RBC, UA: NEGATIVE
Specific Gravity, UA: >1.03 — ABNORMAL HIGH (ref 1.005–1.030)
Urobilinogen, Ur: 0.2 mg/dL (ref 0.2–1.0)
pH, UA: 5.5 (ref 5.0–7.5)

## 2019-11-15 MED ORDER — SODIUM BICARBONATE 8.4 % IV SOLN
11.0000 mL | Freq: Once | INTRAVENOUS | Status: AC
  Administered 2019-11-15: 11 mL

## 2019-11-15 NOTE — Patient Instructions (Signed)
IC/BPS Flare Fact Sheet  Interstitial cystitis patients often struggle with "flares," a sudden and dramatic worsening of their bladder symptoms. Lasting from hours to weeks, flares can be unpredictable, disruptive and difficult to manage for newly diagnosed and veteran IC patients. Bladder wall irritation is the most common type of flare, often occurring after patients eat acidic foods, are under high stress or struggling with hormone changes. Pelvic floor muscle tension can also drive flares, particularly when those muscles are provoked through riding in a car or sexual intimacy. The good news is that flares are generally short term. They do, however, require prompt intervention to reduce/avoid the trigger and minimize the duration of the flare.  Typical Flare Triggers  DIET - 95% of patients report that certain foods exacerbate their IC symptoms. The most common offenders are foods high in acid, caffeine and alcohol.  DRIVING - 50% of IC patients report pain and discomfort while riding in a car, train, airplane or on a motorcycle. Patients are encouraged to avoid long rides until their condition has improved.  STRESS & ANXIETY - High periods of physical or emotional stress can exacerbate symptoms, including periods of intense cold, heat and emotional distress.  SEX & INTIMACY - Men with IC may experience pain at the moment of orgasm while women often feel their worst 24-48 hours after intercourse, the result of pelvic floor muscle spasms.  EXERCISE - Exercise that jars or puts pressure on the pelvic floor (i.e. riding a bicycle or motorcycle, spinning, running or stairs) can exacerbate pelvic pain and bladder symptoms. Exercises that keep the hips level are ideal, such as: walking, elliptical, rowing, gentle yoga and/or pilates.  HORMONES - Many women struggle with short term flares during ovulation and a few days before their period. Ironically, some patients flare when  their progesterone levels are higher, while others flare when their estrogen levels are higher. Post-menopausal women may experience more bladder, urethral and vulvar sensitivity due to dryness of the skin. Using a compounded, preservative-free estrogen cream may help patients with estrogen atrophy.  CHEMICAL EXPOSURE - Chemical exposures can trigger an IC flare, including: scented candles, room fresheners, cleansers, paints, solvents and pest control products. Patients with sensitive skin report that scented laundry detergent, fabric softeners and/or dryer sheets can trigger urethral, vulvar or rectal discomfort.  The [facility] offers more detailed information on flares and hour by hour rescue plans that can help reduce discomfort. 

## 2019-11-15 NOTE — Progress Notes (Signed)
11/15/2019 2:55 PM   Olivia Sullivan May 06, 1996 [id number]  Referring provider: Doren Custard, FNP [address] [place],  [place] [address]  Chief Complaint  Patient presents with  . Post-op Follow-up    2wk    HPI: I was consulted to assess the patient's chronic history of dysuria and frequency. She can void every 30 minutes and cannot hold it for 2 hours. She has no nocturia. She is a reasonable flow but does not necessarily feel empty  She has uncommon urge and stress incontinence but does not wear a pad.   She describes negative urine cultures and having this problem for many years. She even had issues as a child. She has dyspareunia. She does not get a lot of suprapubic discomfort.The burning is near the urethra and vaginal opening when she urinates.  Levator muscles a bit tender as was her bladder. She was quite tender but also nervous that may have an element of vulvodynia.    On urodynamics patient did not void and was catheterized for a few milliliters.  Maximum bladder capacity 230 mL.  She had some cramping in the lower abdomen during bladder filling.  Her bladder was hypersensitive especially at low bladder volumes.  Bladder was unstable reaching a pressure of 5 cm of water.  She did not leak.  No stress incontinence with a Valsalva pressure of 85 cm water.  Near the end of her voluntary void she had a spiking pressure and rated pain as a 10 out of 10.  It reduced to 8 out of 10 after voiding.  She had urethral burning and cramping.  She voided 220 mL with a maximum flow 20 mils per second.  Maximum voiding pressure 39 cm water.  EMG activity increased during voiding.  Bladder neck descent at 1 cm.  The voiding spiking pressure in my opinion was a terminal post void involuntary contraction no evidence of neurogenic bladder dysfunction  Test highly suggest interstitial cystitis.   Proceed with hydrodistention.  My index of suspicion is high she has  interstitial cystitis.  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  She came in 2 days later with worsening pain and saw a Publishing rights manager.  She was emptying well.  She was given Myrbetriq.  She was given Norco.  No urine culture sent  Today's urine was sent for culture but clinically she was not infected.  Discomfort and frequency still present.  We went through pathophysiology of multimodality treatment.  She is an educated woman in we gave her the interstitial cystitis diet and she is already looked into it.  She is continue to do physical for therapy.  She like to do bladder rescue treatments 2-3 times a week for 3 weeks and see nurse practitioner.  She understands that she might start Elmiron and take it for 6 months.  If it works she will check with her eye physician to gain more information regarding rare risks of retina.  This was discussed.  We also talked about a cocktail of medications with such things as amitriptyline hydroxyzine anti-inflammatories and OAB agents that can be started in some combination next time as well if she fails to treatment   PMH: Past Medical History:  Diagnosis Date  . Anxiety   . Depression   . Migraine     Surgical History: Past Surgical History:  Procedure Laterality Date  . COLONOSCOPY  WITH PROPOFOL N/A 03/11/2019   Procedure: COLONOSCOPY WITH PROPOFOL;  Surgeon: Virgel Manifold, MD;  Location: [facility] ENDOSCOPY;  Service: Endoscopy;  Laterality: N/A;  . CYSTO WITH HYDRODISTENSION N/A 10/25/2019   Procedure: CYSTOSCOPY/HYDRODISTENSION;  Surgeon: Bjorn Loser, MD;  Location: [facility] ORS;  Service: Urology;  Laterality: N/A;  . ESOPHAGOGASTRODUODENOSCOPY (EGD) WITH PROPOFOL N/A 03/11/2019   Procedure: ESOPHAGOGASTRODUODENOSCOPY (EGD) WITH PROPOFOL;  Surgeon: Virgel Manifold, MD;  Location: [facility] ENDOSCOPY;  Service: Endoscopy;  Laterality: N/A;  . NO PAST  SURGERIES      Home Medications:  Allergies as of 11/15/2019      Reactions   Latex Rash      Medication List       Accurate as of November 15, 2019  2:55 PM. If you have any questions, ask your nurse or doctor.        ciprofloxacin 250 MG tablet Commonly known as: Cipro Take 1 tablet (250 mg total) by mouth 2 (two) times daily.   doxycycline 100 MG capsule Commonly known as: VIBRAMYCIN Take 100 mg by mouth 2 (two) times daily.   HYDROcodone-acetaminophen 5-325 MG tablet Commonly known as: Norco Take 1-2 tablets by mouth every 6 (six) hours as needed for moderate pain.   mirabegron ER 25 MG Tb24 tablet Commonly known as: MYRBETRIQ Take 1 tablet (25 mg total) by mouth daily.   NORETHINDRONE PO Take by mouth.   PROBIOTIC DAILY PO Take 1 capsule by mouth daily.   Uribel 118 MG Caps Take 1 capsule (118 mg total) by mouth every 6 (six) hours as needed.       Allergies:  Allergies  Allergen Reactions  . Latex Rash    Family History: Family History  Problem Relation Age of Onset  . Diabetes Father   . Depression Father   . Anxiety disorder Father   . Depression Sister   . Anxiety disorder Sister   . Irritable bowel syndrome Sister   . Depression Brother   . Anxiety disorder Brother   . Irritable bowel syndrome Brother   . Diabetes Maternal Grandmother   . Endometriosis Maternal Grandmother   . Endometriosis Maternal Aunt   . Breast cancer Neg Hx   . Ovarian cancer Neg Hx   . Colon cancer Neg Hx     Social History:  reports that she has never smoked. She has never used smokeless tobacco. She reports current alcohol use. She reports that she does not use drugs.  ROS:                                        Physical Exam:  Laboratory Data: Lab Results  Component Value Date   WBC 9.0 05/17/2019   HGB 13.4 05/17/2019   HCT 40.5 05/17/2019   MCV 84.2 05/17/2019   PLT 384 05/17/2019    Lab Results  Component Value Date    CREATININE 0.74 05/17/2019    No results found for: PSA  No results found for: TESTOSTERONE  Lab Results  Component Value Date   HGBA1C 4.9 05/19/2019    Urinalysis    Component Value Date/Time   APPEARANCEUR Clear 10/27/2019 1428   GLUCOSEU Negative 10/27/2019 1428   BILIRUBINUR Negative 10/27/2019 1428   PROTEINUR Negative 10/27/2019 1428   UROBILINOGEN 0.2 07/19/2019 1420   NITRITE Negative 10/27/2019 1428   LEUKOCYTESUR Negative 10/27/2019 1428    Pertinent Imaging:  Assessment & Plan: As noted.  Rescue treatment delivered today  There are no diagnoses linked to this encounter.  No follow-ups on file.  Martina Sinner, MD  [facility] [address], [address] [place], [place] [address] [phone]

## 2019-11-16 ENCOUNTER — Ambulatory Visit: Payer: BC Managed Care – PPO | Admitting: Physician Assistant

## 2019-11-16 NOTE — Progress Notes (Signed)
Bladder Rescue Solution Instillation  Due to IC patient is present today for a Rescue Solution Treatment.  Patient was cleaned and prepped in a sterile fashion with betadine and lidocaine 2% jelly was instilled into the urethra.  A 14 FR catheter was inserted, urine return was noted 68ml, urine was yellow in color.  Instilled a solution consisting of 23ml of Sodium Bicarb, 2 ml Lidocaine and 1 ml of Heparin. The catheter was then removed. Patient tolerated well, no complications were noted.   Performed by: Milas Kocher, CMA  Follow up/ Additional Notes: Will continue two times a week

## 2019-11-17 DIAGNOSIS — I881 Chronic lymphadenitis, except mesenteric: Secondary | ICD-10-CM | POA: Diagnosis not present

## 2019-11-18 ENCOUNTER — Encounter: Payer: Self-pay | Admitting: Physician Assistant

## 2019-11-18 ENCOUNTER — Ambulatory Visit (INDEPENDENT_AMBULATORY_CARE_PROVIDER_SITE_OTHER): Payer: BC Managed Care – PPO | Admitting: Physician Assistant

## 2019-11-18 ENCOUNTER — Other Ambulatory Visit: Payer: Self-pay

## 2019-11-18 VITALS — Ht 65.0 in

## 2019-11-18 DIAGNOSIS — N3020 Other chronic cystitis without hematuria: Secondary | ICD-10-CM | POA: Diagnosis not present

## 2019-11-18 LAB — CULTURE, URINE COMPREHENSIVE

## 2019-11-18 MED ORDER — SODIUM BICARBONATE 8.4 % IV SOLN
11.0000 mL | Freq: Once | INTRAVENOUS | Status: AC
  Administered 2019-11-18: 11 mL

## 2019-11-18 NOTE — Progress Notes (Signed)
Bladder Rescue Solution Instillation  Due to IC patient is present today for a Rescue Solution Treatment.  Patient was cleaned and prepped in a sterile fashion with betadine and lidocaine 2% jelly was instilled into the urethra.  A 14 FR catheter was inserted, urine return was noted 59ml, urine was yellow in color.  Instilled a solution consisting of 42ml of Sodium Bicarb, 2 ml Lidocaine and 1 ml of Heparin. The catheter was then removed. Patient tolerated well, no complications were noted.   Performed by: Carman Ching, PA-C and Milas Kocher, CMA  Follow up/ Additional Notes: Continue 2 times weekly x3 weeks

## 2019-11-25 ENCOUNTER — Ambulatory Visit: Payer: BC Managed Care – PPO

## 2019-11-30 ENCOUNTER — Ambulatory Visit (INDEPENDENT_AMBULATORY_CARE_PROVIDER_SITE_OTHER): Payer: BC Managed Care – PPO | Admitting: Physician Assistant

## 2019-11-30 ENCOUNTER — Encounter: Payer: Self-pay | Admitting: Physician Assistant

## 2019-11-30 ENCOUNTER — Other Ambulatory Visit: Payer: Self-pay

## 2019-11-30 VITALS — BP 110/78 | HR 85 | Ht 68.0 in | Wt 160.0 lb

## 2019-11-30 DIAGNOSIS — N3020 Other chronic cystitis without hematuria: Secondary | ICD-10-CM

## 2019-11-30 MED ORDER — SODIUM BICARBONATE 8.4 % IV SOLN
11.0000 mL | Freq: Once | INTRAVENOUS | Status: AC
  Administered 2019-11-30: 11 mL

## 2019-11-30 NOTE — Progress Notes (Signed)
Bladder Rescue Solution Instillation #3  Due to IC patient is present today for a Rescue Solution Treatment.  Patient was cleaned and prepped in a sterile fashion with betadine.  A 14 FR catheter was inserted, urine return was noted 2ml, urine was yellow in color.  Instilled a solution consisting of 16ml of Sodium Bicarb, 2 ml Lidocaine and 1 ml of Heparin. The catheter was then removed. Patient tolerated well, no complications were noted.   Performed by: Carman Ching, PA-C and Ples Specter, CMA  Follow up/ Additional Notes: As below. Future Appointments  Date Time Provider Department Center  12/02/2019  3:30 PM Carman Ching, [place] [facility] None  12/14/2019  3:30 PM Ruble Buttler, Lelon Mast, PA-C [facility] None  12/16/2019  3:30 PM Jaren Kearn, Lelon Mast, PA-C [facility] None  12/20/2019  3:30 PM Laquanta Hummel, Lelon Mast, PA-C [facility] None

## 2019-12-01 LAB — URINALYSIS, COMPLETE
Bilirubin, UA: NEGATIVE
Glucose, UA: NEGATIVE
Ketones, UA: NEGATIVE
Leukocytes,UA: NEGATIVE
Nitrite, UA: NEGATIVE
Protein,UA: NEGATIVE
RBC, UA: NEGATIVE
Specific Gravity, UA: 1.025 (ref 1.005–1.030)
Urobilinogen, Ur: 0.2 mg/dL (ref 0.2–1.0)
pH, UA: 7 (ref 5.0–7.5)

## 2019-12-01 LAB — MICROSCOPIC EXAMINATION
Bacteria, UA: NONE SEEN
RBC, Urine: NONE SEEN /hpf (ref 0–2)

## 2019-12-02 ENCOUNTER — Ambulatory Visit: Payer: BC Managed Care – PPO | Admitting: Physician Assistant

## 2019-12-09 ENCOUNTER — Other Ambulatory Visit: Payer: Self-pay

## 2019-12-09 ENCOUNTER — Ambulatory Visit: Payer: BC Managed Care – PPO | Attending: Certified Nurse Midwife

## 2019-12-09 DIAGNOSIS — M62838 Other muscle spasm: Secondary | ICD-10-CM | POA: Diagnosis not present

## 2019-12-09 DIAGNOSIS — R293 Abnormal posture: Secondary | ICD-10-CM | POA: Insufficient documentation

## 2019-12-09 DIAGNOSIS — N3946 Mixed incontinence: Secondary | ICD-10-CM | POA: Diagnosis not present

## 2019-12-09 DIAGNOSIS — R2689 Other abnormalities of gait and mobility: Secondary | ICD-10-CM | POA: Diagnosis not present

## 2019-12-09 NOTE — Patient Instructions (Signed)
  Take 5 deep breaths, allowing gravity to gently pull you into a deeper stretch with each breath. Repeat 2-3 times, once a day.    Place foam roller or towel under your upper back between your shoulder blades. Support your head with your hands, elbows forward, and gently rock back and forth and side to side to improve motion in your back.    Shoulder Retraction and Downward Rotation   Rotate the shoulder blades back and down as if you had to hold a pencil between them, holding for 1 full second each time. Repeat this _3x10_ times _1-2_ times per day.   Alternate between these two exercises (10 of each x3)    Start with Shoulder Retraction and Downward Rotation pictures above, holding the position while pulling the chin straight back as if trying to make a "double chin".  Breathe in forward and breathe out as you pull back, repeating this _3x10__ times _1-2___ times per day.

## 2019-12-09 NOTE — Therapy (Signed)
[facility] MAIN [facility] SERVICES [address] [place], [place], [address] Phone: [phone]   Fax:  [phone]  Physical Therapy Treatment  The patient has been informed of current processes in place at Outpatient Rehab to protect patients from Covid-19 exposure including social distancing, schedule modifications, and new cleaning procedures. After discussing their particular risk with a therapist based on the patient's personal risk factors, the patient has decided to proceed with in-person therapy.   Patient Details  Name: Olivia Sullivan MRN: [id number] Date of Birth: Apr 14, 1996 Referring Provider (PT): Rose Phi CNM    Encounter Date: 12/09/2019  PT End of Session - 12/10/19 1006    Visit Number  8    Number of Visits  10    Date for PT Re-Evaluation  10/19/19    Authorization - Visit Number  8    Authorization - Number of Visits  10    PT Start Time  1630    PT Stop Time  1730    PT Time Calculation (min)  60 min    Activity Tolerance  Patient tolerated treatment well;No increased pain    Behavior During Therapy  WFL for tasks assessed/performed       Past Medical History:  Diagnosis Date  . Anxiety   . Depression   . Migraine     Past Surgical History:  Procedure Laterality Date  . COLONOSCOPY WITH PROPOFOL N/A 03/11/2019   Procedure: COLONOSCOPY WITH PROPOFOL;  Surgeon: [name], MD;  Location: [facility] END[facility]COPY;  Service: Endoscopy;  Laterality: N/A;  . CYSTO WITH HYDRODISTENSION N/A 10/25/2019   Procedure: CYSTOSCOPY/HYDRODISTENSION;  Surgeon: Alfredo Martinez, MD;  Location: [facility] ORS;  Service: Urology;  Laterality: N/A;  . ESOPHAGOGASTRODUODENOSCOPY (EGD) WITH PROPOFOL N/A 03/11/2019   Procedure: ESOPHAGOGASTRODUODENOSCOPY (EGD) WITH PROPOFOL;  Surgeon: [name], MD;  Location: [facility] ENDOSCOPY;  Service: Endoscopy;  Laterality: N/A;  . NO PAST SURGERIES      There were no vitals filed for this  visit.    Pelvic Floor Physical Therapy Treatment Note  SCREENING  Changes in medications, allergies, or medical history?: No    SUBJECTIVE  Patient reports: Bladder distension went fine but has not noticed a difference. Has been having a procedure weekly for 2-3 weeks where they go in and push medication into her bladder but she has not seen an improvement yet.   Precautions:  N/A   Pain update:  Location of pain 1 :  (neck/R shoulder) Current pain:  7/10  Max pain: 7/10  Least pain:  3/10  Nature of pain: sharp  **decreased pain on the R, pain with looking up  Pain at vulva: Burning sensation: 3/10 on NPS  Max: 6/10 on NPS  Min: 2/10 - something is always present  Ice assists. Few times a week. After trying intercourse.   Location of pain 2 B hip (R >L)  Current pain: 0/10 on NPS  Max pain: 0/10 on NPS (when up and walking) Least: 0/10 on NPS (dull)    Patient Goals: To be pain free. The most aggravating is with urination.    OBJECTIVE  Changes in: Posture/Observations:  FHP, forward rolled shoulders, elevated scapulae  Range of Motion/Flexibilty:  Pt. Unable to maintain scapular depression with retraction pre-treatment, able to perform correctly following treatment. ~12 in. From floor with forward bend. Decreased hip ABD/ER ROM and hamstring length B.  -Following treatment, Pt. Able to perform frog stretch without sharp pain but not v-sit (likely due  to HS length/spasm)   Strength/MMT: N/A   Strength/MMT:  LE MMT  LE MMT Left Right  Hip flex:  (L2) /5 /5  Hip ext: /5 /5  Hip abd: /5 /5  Hip add: /5 /5  Hip IR /5 /5  Hip ER /5 /5     Abdominal:  Palpation: Diastasis:  Pelvic Floor External Exam: (From previous session) [Introitus Appears: WNL Skin integrity: WNL Palpation: no TTP externally to PFM, R adductors>L TTP Cough: intact Prolapse visible?: no Scar mobility: N/A  Internal Vaginal Exam: Strength (PERF): 3/5, 4 sec, 1  time   Symmetry: similar internally Palpation: TTP throughout with R anterior PR/PC re-creating burning/urgency sensation and burning at B posterior fourchette Prolapse: none]  Abdominal:    Palpation: TTP to TP release to B pectineus and gracilis, levator scap and Teres major.  Gait Analysis: N/A   INTERVENTIONS THIS SESSION: Manual Therapy: Performed TP release to B pectineus and gracilis, levator scap and Teres major to decrease spasm and pain and allow for improved balance of musculature for improved function, scapular tracking, and decreased symptoms. .  Therex: educated on and practiced  Educated on scap retractions, chin-tucks, extensions over towel roll, and frog stretch to maintain and improve muscle length and allow for improved balance of musculature for long-term symptom relief and to improve mobility of joint and surrounding connective tissue and decrease pressure on nerve roots for improved conductivity and function of down-stream tissues.   .    Total time: 60 minutes                              PT Short Term Goals - 08/11/19 1052      PT SHORT TERM GOAL #1   Title  Patient will demonstrate a coordinated contraction, relaxation, and bulge of the pelvic floor muscles to demonstrate functional recruitment and motion and allow for further strengthening.    Baseline  paradoxical TA activation with breathing    Time  5    Period  Weeks    Status  New    Target Date  09/14/19      PT SHORT TERM GOAL #2   Title  Patient will demonstrate appropriate body mechanics with ambulation to allow for decreased stress on the pelvic floor and low back.    Baseline  antalgic, slow gait    Time  5    Period  Weeks    Status  New    Target Date  09/14/19      PT SHORT TERM GOAL #3   Title  Patient will report a reduction in pain to no greater than 5/10 over the prior week to demonstrate symptom improvement.    Baseline  7-8/10 on NPS at LBP, hips and vulva     Time  5    Period  Weeks    Status  New    Target Date  09/14/19        PT Long Term Goals - 08/11/19 1056      PT LONG TERM GOAL #1   Title  Patient will report no episodes of mixed UI over the course of the prior two weeks to demonstrate improved functional ability.    Baseline  mixed UI- urgency and stress leakage every day    Time  10    Period  Weeks    Status  New    Target Date  10/19/19  PT LONG TERM GOAL #2   Title  Patient will demonstrate improvement on Female Sexual Function Index (FSFI) from 23 to 46 demonstrating improved QOL with sexual functions.    Baseline  23/100- indicating high risk for sexual dysfunction    Time  10    Period  Weeks    Status  New    Target Date  10/19/19      PT LONG TERM GOAL #3   Title  Patient will score less than or equal to 20% on the Female NIH-CPSI to demonstrate a reduction in pain, urinary symptoms, and an improved quality of life.    Baseline  34/44 (78%)    Time  10    Period  Weeks    Status  New    Target Date  10/19/19      PT LONG TERM GOAL #4   Title  Patient will describe pain no greater than 2/10 during ADL's, work, and following intercourse to demonstrate improved functional ability.    Baseline  7-8/10 on NPS at LBP, hips and vulva increased by above activities.    Time  10    Period  Weeks    Status  New    Target Date  10/19/19            Plan - 12/10/19 1007    Clinical Impression Statement  Pt. Responded well to all interventions today, demonstrating decreased spasms and pain, increased ROM, and scapulohumeral rhythm as well as understanding and correct performance of all education and exercises provided today. They will continue to benefit from skilled physical therapy to work toward remaining goals and maximize function as well as decrease likelihood of symptom increase or recurrence.     Personal Factors and Comorbidities  Behavior Pattern;Comorbidity 2;Past/Current Experience;Time since  onset of injury/illness/exacerbation    Comorbidities  anxiety/depression/generalized personality/disorder; headaches/migraines.    Examination-Activity Limitations  Sit;Stand;Toileting;Continence;Hygiene/Grooming    Examination-Participation Restrictions  Interpersonal Relationship;Community Activity    Stability/Clinical Decision Making  Unstable/Unpredictable    Rehab Potential  Good    PT Frequency  2x / week    PT Duration  --   10 weeks   PT Treatment/Interventions  ADLs/Self Care Home Management;Biofeedback;Electrical Stimulation;Moist Heat;Traction;Ultrasound;Stair training;Functional mobility training;Therapeutic activities;Gait training;Balance training;Therapeutic exercise;Neuromuscular re-education;Patient/family education;Manual techniques;Dry needling;Energy conservation;Spinal Manipulations;Joint Manipulations    PT Next Visit Plan  Further internal PFM TP release to posterior PR/PC/IC/coccygeus, re-check for LLD when pelvic alignment is improved;    PT Home Exercise Plan  diaphragmatic breathing in seated and hook-lying; side stretch (R>L); seated pelvic tilts; sleeping positions; R anterior innominate correction; bridges, supine and seated chin-tucks, child's pose with pillow under knees, scapular retractions, frog stretch, thoracic extensions over a towel    Consulted and Agree with Plan of Care  Patient       Patient will benefit from skilled therapeutic intervention in order to improve the following deficits and impairments:  Abnormal gait, Decreased activity tolerance, Decreased balance, Decreased coordination, Decreased endurance, Decreased mobility, Decreased range of motion, Decreased strength, Increased muscle spasms, Increased fascial restricitons, Postural dysfunction, Improper body mechanics, Pain  Visit Diagnosis: Other muscle spasm  Mixed incontinence  Abnormal posture  Other abnormalities of gait and mobility     Problem List Patient Active Problem List    Diagnosis Date Noted  . Generalized abdominal pain   . Rectum inflammation   . Internal hemorrhoids   . Diverticulosis of small intestine without hemorrhage   . Nausea and vomiting   .  Lower abdominal pain 01/29/2019  . Diarrhea 01/29/2019  . GAD (generalized anxiety disorder) 01/29/2019  . Severe episode of recurrent major depressive disorder, without psychotic features (HCC) 01/29/2019  . Panic disorder with agoraphobia 06/19/2018   Cleophus Molt DPT, ATC Cleophus Molt 12/10/2019, 10:13 AM  [facility] MAIN [facility] SERVICES [address] [place], [place], [address] Phone: [phone]   Fax:  [phone]  Name: Olivia Sullivan MRN: [id number] Date of Birth: [birth year]

## 2019-12-14 ENCOUNTER — Ambulatory Visit: Payer: BC Managed Care – PPO | Admitting: Physician Assistant

## 2019-12-16 ENCOUNTER — Ambulatory Visit: Payer: BC Managed Care – PPO

## 2019-12-16 ENCOUNTER — Ambulatory Visit: Payer: BC Managed Care – PPO | Admitting: Physician Assistant

## 2019-12-16 ENCOUNTER — Other Ambulatory Visit: Payer: Self-pay

## 2019-12-16 DIAGNOSIS — R2689 Other abnormalities of gait and mobility: Secondary | ICD-10-CM

## 2019-12-16 DIAGNOSIS — M62838 Other muscle spasm: Secondary | ICD-10-CM | POA: Diagnosis not present

## 2019-12-16 DIAGNOSIS — R293 Abnormal posture: Secondary | ICD-10-CM | POA: Diagnosis not present

## 2019-12-16 DIAGNOSIS — N3946 Mixed incontinence: Secondary | ICD-10-CM | POA: Diagnosis not present

## 2019-12-16 NOTE — Therapy (Signed)
[place] MAIN [facility] SERVICES [address] [place], [place], [address] Phone: [phone]   Fax:  [phone]  Physical Therapy Treatment  The patient has been informed of current processes in place at Outpatient Rehab to protect patients from Covid-19 exposure including social distancing, schedule modifications, and new cleaning procedures. After discussing their particular risk with a therapist based on the patient's personal risk factors, the patient has decided to proceed with in-person therapy.   Patient Details  Name: Olivia Sullivan MRN: [id number] Date of Birth: 1995-12-03 Referring Provider (PT): Bluford Main CNM    Encounter Date: 12/16/2019  PT End of Session - 12/17/19 0757    Visit Number  9    Number of Visits  10    Date for PT Re-Evaluation  10/19/19    Authorization - Visit Number  9    Authorization - Number of Visits  10    PT Start Time  4825    PT Stop Time  0037    PT Time Calculation (min)  60 min    Activity Tolerance  Patient tolerated treatment well;No increased pain    Behavior During Therapy  WFL for tasks assessed/performed       Past Medical History:  Diagnosis Date  . Anxiety   . Depression   . Migraine     Past Surgical History:  Procedure Laterality Date  . COLONOSCOPY WITH PROPOFOL N/A 03/11/2019   Procedure: COLONOSCOPY WITH PROPOFOL;  Surgeon: Virgel Manifold, MD;  Location: [facility] END[facility];  Service: Endoscopy;  Laterality: N/A;  . CYSTO WITH HYDRODISTENSION N/A 10/25/2019   Procedure: CYSTOSCOPY/HYDRODISTENSION;  Surgeon: Bjorn Loser, MD;  Location: [facility] ORS;  Service: Urology;  Laterality: N/A;  . ESOPHAGOGASTRODUODENOSCOPY (EGD) WITH PROPOFOL N/A 03/11/2019   Procedure: ESOPHAGOGASTRODUODENOSCOPY (EGD) WITH PROPOFOL;  Surgeon: Virgel Manifold, MD;  Location: [facility];  Service: Endoscopy;  Laterality: N/A;  . NO PAST SURGERIES      There were no vitals filed for this  visit.   Pelvic Floor Physical Therapy Treatment Note  SCREENING  Changes in medications, allergies, or medical history?: Discontinued the treatments that were aggravating her bladder.   SUBJECTIVE  Patient reports: She has not had much burning, a little today but not bad and it went away quickly. Has been walking with a little limp today, not sure what she did but R low back, hip, and thigh have some stabbing pain that started today at  Work. R shoulder and neck are still painful. Slight burning with urination but not as bad, goes away after ~ 1 hour instead of all day. Has been drinking "a lot" more water. ~ 1/2 a gallon.  Precautions:  N/A   Pain update:  Location of pain 1 :  (neck/R shoulder) Current pain:  5/10  Max pain: 8/10  Least pain:  2/10  Nature of pain: sharp  **no pain following treatment.  Pain at vulva: Burning sensation: 0/10 on NPS  Max: 3/10 on NPS  Min: 0/10 - something is always present  Ice assists. Few times a week. After trying intercourse.   Location of pain 2 R hip Current pain: 6/10 on NPS  Max pain: 8/10 on NPS (when up and walking) Least: 0/10 on NPS (dull)    Patient Goals: To be pain free. The most aggravating is with urination.    OBJECTIVE  Changes in: Posture/Observations:  L up-slip, R anterior rotation.  Range of Motion/Flexibilty:   Strength/MMT: N/A   Pelvic  Floor External Exam: (From previous session) [Introitus Appears: WNL Skin integrity: WNL Palpation: no TTP externally to PFM, R adductors>L TTP Cough: intact Prolapse visible?: no Scar mobility: N/A  Internal Vaginal Exam: Strength (PERF): 3/5, 4 sec, 1  time  Symmetry: similar internally Palpation: TTP throughout with R anterior PR/PC re-creating burning/urgency sensation and burning at B posterior fourchette Prolapse: none]  Abdominal:    Palpation: TTP to L QL and R Iliacus  Gait Analysis: Pt not using eccentric control for feet upon  arrival  INTERVENTIONS THIS SESSION: Manual Therapy: Performed TP release to L QL and R Iliacus to decrease spasm and pain and allow for improved balance of musculature for improved function, and decreased symptoms. Performed MET correction for R anterior rotation and L up-slip correction to decrease pain and improve movement patterns to prevent worsening Sx.    Total time: 60 minutes                            PT Short Term Goals - 08/11/19 1052      PT SHORT TERM GOAL #1   Title  Patient will demonstrate a coordinated contraction, relaxation, and bulge of the pelvic floor muscles to demonstrate functional recruitment and motion and allow for further strengthening.    Baseline  paradoxical TA activation with breathing    Time  5    Period  Weeks    Status  New    Target Date  09/14/19      PT SHORT TERM GOAL #2   Title  Patient will demonstrate appropriate body mechanics with ambulation to allow for decreased stress on the pelvic floor and low back.    Baseline  antalgic, slow gait    Time  5    Period  Weeks    Status  New    Target Date  09/14/19      PT SHORT TERM GOAL #3   Title  Patient will report a reduction in pain to no greater than 5/10 over the prior week to demonstrate symptom improvement.    Baseline  7-8/10 on NPS at LBP, hips and vulva    Time  5    Period  Weeks    Status  New    Target Date  09/14/19        PT Long Term Goals - 08/11/19 1056      PT LONG TERM GOAL #1   Title  Patient will report no episodes of mixed UI over the course of the prior two weeks to demonstrate improved functional ability.    Baseline  mixed UI- urgency and stress leakage every day    Time  10    Period  Weeks    Status  New    Target Date  10/19/19      PT LONG TERM GOAL #2   Title  Patient will demonstrate improvement on Female Sexual Function Index (FSFI) from 23 to 46 demonstrating improved QOL with sexual functions.    Baseline  23/100-  indicating high risk for sexual dysfunction    Time  10    Period  Weeks    Status  New    Target Date  10/19/19      PT LONG TERM GOAL #3   Title  Patient will score less than or equal to 20% on the Female NIH-CPSI to demonstrate a reduction in pain, urinary symptoms, and an improved quality of life.  Baseline  34/44 (78%)    Time  10    Period  Weeks    Status  New    Target Date  10/19/19      PT LONG TERM GOAL #4   Title  Patient will describe pain no greater than 2/10 during ADL's, work, and following intercourse to demonstrate improved functional ability.    Baseline  7-8/10 on NPS at LBP, hips and vulva increased by above activities.    Time  10    Period  Weeks    Status  New    Target Date  10/19/19            Plan - 12/17/19 0758    Clinical Impression Statement  Pt. Responded well to all interventions today, demonstrating improved pelvic alignment and resolution of pain as well as understanding and correct performance of all education and exercises provided today. They will continue to benefit from skilled physical therapy to work toward remaining goals and maximize function as well as decrease likelihood of symptom increase or recurrence.     PT Next Visit Plan  Further internal PFM TP release to posterior PR/PC/IC/coccygeus, re-check for LLD when pelvic alignment is improved; up-grade deep-core/review posture    PT Home Exercise Plan  diaphragmatic breathing in seated and hook-lying; side stretch (R>L); seated pelvic tilts; sleeping positions; R anterior innominate correction; bridges, supine and seated chin-tucks, child's pose with pillow under knees, scapular retractions, frog stretch, thoracic extensions over a towel    Consulted and Agree with Plan of Care  Patient       Patient will benefit from skilled therapeutic intervention in order to improve the following deficits and impairments:     Visit Diagnosis: Other muscle spasm  Mixed  incontinence  Abnormal posture  Other abnormalities of gait and mobility     Problem List Patient Active Problem List   Diagnosis Date Noted  . Generalized abdominal pain   . Rectum inflammation   . Internal hemorrhoids   . Diverticulosis of small intestine without hemorrhage   . Nausea and vomiting   . Lower abdominal pain 01/29/2019  . Diarrhea 01/29/2019  . GAD (generalized anxiety disorder) 01/29/2019  . Severe episode of recurrent major depressive disorder, without psychotic features ([place]) 01/29/2019  . Panic disorder with agoraphobia 06/19/2018    Willa Rough 12/17/2019, 8:00 AM  [place] MAIN [facility] SERVICES [place], [place], [address] Phone: [phone]   Fax:  [phone]  Name: Olivia Sullivan MRN: [id number] Date of Birth: 14-Jun-1996

## 2019-12-20 ENCOUNTER — Other Ambulatory Visit: Payer: Self-pay

## 2019-12-20 ENCOUNTER — Ambulatory Visit (INDEPENDENT_AMBULATORY_CARE_PROVIDER_SITE_OTHER): Payer: BC Managed Care – PPO | Admitting: Physician Assistant

## 2019-12-20 ENCOUNTER — Encounter: Payer: Self-pay | Admitting: Physician Assistant

## 2019-12-20 VITALS — BP 108/69 | HR 89 | Ht 64.0 in | Wt 155.0 lb

## 2019-12-20 DIAGNOSIS — N3010 Interstitial cystitis (chronic) without hematuria: Secondary | ICD-10-CM | POA: Diagnosis not present

## 2019-12-20 NOTE — Progress Notes (Signed)
12/20/2019 3:52 PM   Mariangel Dorantes Sep 26, 1996 [id number]  CC: IC follow-up  HPI: Olivia Sullivan is a 24 y.o. female who presents today for follow-up of IC with positive hydrodistention with Dr. Matilde Sprang in January 2021. She began a course of bladder rescue solutions in clinic on 11/15/2019, however stopped these after 3 installations with reports of worsened dysuria.  Today, she states her dysuria is improved following cessation of bladder rescue installations.  She started pelvic PT late last year.  PMH: Past Medical History:  Diagnosis Date  . Anxiety   . Depression   . Migraine     Surgical History: Past Surgical History:  Procedure Laterality Date  . COLONOSCOPY WITH PROPOFOL N/A 03/11/2019   Procedure: COLONOSCOPY WITH PROPOFOL;  Surgeon: Virgel Manifold, MD;  Location: [facility] END[facility];  Service: Endoscopy;  Laterality: N/A;  . CYSTO WITH HYDRODISTENSION N/A 10/25/2019   Procedure: CYSTOSCOPY/HYDRODISTENSION;  Surgeon: Bjorn Loser, MD;  Location: [facility] ORS;  Service: Urology;  Laterality: N/A;  . ESOPHAGOGASTRODUODENOSCOPY (EGD) WITH PROPOFOL N/A 03/11/2019   Procedure: ESOPHAGOGASTRODUODENOSCOPY (EGD) WITH PROPOFOL;  Surgeon: Virgel Manifold, MD;  Location: [facility];  Service: Endoscopy;  Laterality: N/A;  . NO PAST SURGERIES      Home Medications:  Allergies as of 12/20/2019      Reactions   Latex Rash      Medication List       Accurate as of December 20, 2019  3:52 PM. If you have any questions, ask your nurse or doctor.        NORETHINDRONE PO Take by mouth.   PROBIOTIC DAILY PO Take 1 capsule by mouth daily.       Allergies:  Allergies  Allergen Reactions  . Latex Rash    Family History: Family History  Problem Relation Age of Onset  . Diabetes Father   . Depression Father   . Anxiety disorder Father   . Depression Sister   . Anxiety disorder Sister   . Irritable bowel syndrome Sister   . Depression Brother   . Anxiety  disorder Brother   . Irritable bowel syndrome Brother   . Diabetes Maternal Grandmother   . Endometriosis Maternal Grandmother   . Endometriosis Maternal Aunt   . Breast cancer Neg Hx   . Ovarian cancer Neg Hx   . Colon cancer Neg Hx     Social History:   reports that she has never smoked. She has never used smokeless tobacco. She reports current alcohol use. She reports that she does not use drugs.  Physical Exam: BP 108/69   Pulse 89   Ht 5\' 4"  (1.626 m)   Wt 155 lb (70.3 kg)   BMI 26.61 kg/m   Constitutional:  Alert and oriented, no acute distress, nontoxic appearing HEENT: [place], AT Cardiovascular: No clubbing, cyanosis, or edema Respiratory: Normal respiratory effort, no increased work of breathing Skin: No rashes, bruises or suspicious lesions Neurologic: Grossly intact, no focal deficits, moving all 4 extremities Psychiatric: Normal mood and affect  Assessment & Plan:   1. Interstitial cystitis 24 year old female with IC having recently undergone bladder rescue installations presents today for follow-up.  She states the bladder rescue installations made her dysuria worse, and her symptoms have improved since stopping these.  We briefly discussed alternative treatment options, including Elmiron, amitriptyline, and hydroxyzine.  Patient would prefer to defer these at this time pending additional pelvic PT sessions.  I am in agreement with this plan.  Return if symptoms worsen or  fail to improve.  Carman Ching, PA-C  [facility] [address] [place], [place] [address] [phone]

## 2019-12-23 ENCOUNTER — Ambulatory Visit: Payer: BC Managed Care – PPO

## 2019-12-30 ENCOUNTER — Ambulatory Visit: Payer: BC Managed Care – PPO

## 2019-12-30 ENCOUNTER — Other Ambulatory Visit: Payer: Self-pay

## 2019-12-30 DIAGNOSIS — R293 Abnormal posture: Secondary | ICD-10-CM

## 2019-12-30 DIAGNOSIS — R2689 Other abnormalities of gait and mobility: Secondary | ICD-10-CM | POA: Diagnosis not present

## 2019-12-30 DIAGNOSIS — N3946 Mixed incontinence: Secondary | ICD-10-CM

## 2019-12-30 DIAGNOSIS — M62838 Other muscle spasm: Secondary | ICD-10-CM

## 2019-12-30 NOTE — Therapy (Signed)
[facility] MAIN [facility] SERVICES [address] [place], [place], [address] Phone: [phone]   Fax:  [phone]  Physical Therapy Treatment  The patient has been informed of current processes in place at Outpatient Rehab to protect patients from Covid-19 exposure including social distancing, schedule modifications, and new cleaning procedures. After discussing their particular risk with a therapist based on the patient's personal risk factors, the patient has decided to proceed with in-person therapy.   Patient Details  Name: Olivia Sullivan MRN: [id number] Date of Birth: 1995-11-26 Referring Provider (PT): Rose Phi CNM    Encounter Date: 12/30/2019  PT End of Session - 12/31/19 1104    Visit Number  10    Number of Visits  10    Date for PT Re-Evaluation  10/19/19    Authorization - Visit Number  10    Authorization - Number of Visits  10    PT Start Time  1640    PT Stop Time  1740    PT Time Calculation (min)  60 min    Activity Tolerance  Patient tolerated treatment well;No increased pain    Behavior During Therapy  WFL for tasks assessed/performed       Past Medical History:  Diagnosis Date  . Anxiety   . Depression   . Migraine     Past Surgical History:  Procedure Laterality Date  . COLONOSCOPY WITH PROPOFOL N/A 03/11/2019   Procedure: COLONOSCOPY WITH PROPOFOL;  Surgeon: [name], MD;  Location: [facility] ENDOSCOPY;  Service: Endoscopy;  Laterality: N/A;  . CYSTO WITH HYDRODISTENSION N/A 10/25/2019   Procedure: CYSTOSCOPY/HYDRODISTENSION;  Surgeon: Alfredo Martinez, MD;  Location: [facility] ORS;  Service: Urology;  Laterality: N/A;  . ESOPHAGOGASTRODUODENOSCOPY (EGD) WITH PROPOFOL N/A 03/11/2019   Procedure: ESOPHAGOGASTRODUODENOSCOPY (EGD) WITH PROPOFOL;  Surgeon: [name], MD;  Location: [facility] ENDOSCOPY;  Service: Endoscopy;  Laterality: N/A;  . NO PAST SURGERIES      There were no vitals filed for this  visit.  Pelvic Floor Physical Therapy Treatment Note  SCREENING  Changes in medications, allergies, or medical history?: Discontinued the treatments that were aggravating her bladder.   SUBJECTIVE  Patient reports: She has not had much burning, a little today but not bad and it went away quickly. Has been walking with a little limp today, not sure what she did but R low back, hip, and thigh have some stabbing pain that started today at  Work. R shoulder and neck are still painful. Slight burning with urination but not as bad, goes away after ~ 1 hour instead of all day. Has been drinking "a lot" more water. ~ 1/2 a gallon.  Precautions:  N/A   Pain update:  Location of pain 1 :  (neck/R shoulder) Current pain:  1/10  Max pain: 3/10  Least pain:  2/10  Nature of pain: sharp   Pain at vulva: Burning sensation: 4/10 on NPS (after urinating) Max:10/10 on NPS  Min: 0/10 - something is always present  Ice assists. Few times a week. After trying intercourse.   Location of pain 2 inner thigh Current pain: 6/10 on NPS  Max pain: 6/10 on NPS (when up and walking) Least: 0/10 on NPS (dull)   **0/10 following treatment.    Patient Goals: To be pain free. The most aggravating is with urination.    OBJECTIVE  Changes in: Posture/Observations:  L up-slip, R anterior rotation.  Range of Motion/Flexibilty:   Strength/MMT: N/A   Pelvic  Floor External Exam: (From previous session) [Introitus Appears: WNL Skin integrity: WNL Palpation: no TTP externally to PFM, R adductors>L TTP Cough: intact Prolapse visible?: no Scar mobility: N/A  Internal Vaginal Exam: Strength (PERF): 3/5, 4 sec, 1  time  Symmetry: similar internally Palpation: TTP throughout with R anterior PR/PC re-creating burning/urgency sensation and burning at B posterior fourchette Prolapse: none]  Abdominal:    Palpation: TTP to B adductor brevis and pectineus  Gait Analysis: Pt not using eccentric  control for feet upon arrival  INTERVENTIONS THIS SESSION: Manual Therapy: Performed TP release to B adductor brevis and pectineus to decrease spasm and pain and allow for improved balance of musculature for improved function and decreased symptoms.  Self-care: educated on using arch supports and how to do self TP release with a tennis ball to decrease foot tightness that can overflow into PFM.  Therex: educated on toe scrunches, arch crunches, and calf stretch to decrease pain and spasms/cramps in feet and calves to prevent neural overflow into pelvis. Educated on hip ABD in side-lying to improve strength of muscles opposing tight musculature to allow reciprocal inhibition to improve balance of musculature surrounding the pelvis and improve overall posture for optimal musculature length-tension relationship and function.     Total time: 60 minutes                              PT Short Term Goals - 08/11/19 1052      PT SHORT TERM GOAL #1   Title  Patient will demonstrate a coordinated contraction, relaxation, and bulge of the pelvic floor muscles to demonstrate functional recruitment and motion and allow for further strengthening.    Baseline  paradoxical TA activation with breathing    Time  5    Period  Weeks    Status  New    Target Date  09/14/19      PT SHORT TERM GOAL #2   Title  Patient will demonstrate appropriate body mechanics with ambulation to allow for decreased stress on the pelvic floor and low back.    Baseline  antalgic, slow gait    Time  5    Period  Weeks    Status  New    Target Date  09/14/19      PT SHORT TERM GOAL #3   Title  Patient will report a reduction in pain to no greater than 5/10 over the prior week to demonstrate symptom improvement.    Baseline  7-8/10 on NPS at LBP, hips and vulva    Time  5    Period  Weeks    Status  New    Target Date  09/14/19        PT Long Term Goals - 08/11/19 1056      PT LONG TERM  GOAL #1   Title  Patient will report no episodes of mixed UI over the course of the prior two weeks to demonstrate improved functional ability.    Baseline  mixed UI- urgency and stress leakage every day    Time  10    Period  Weeks    Status  New    Target Date  10/19/19      PT LONG TERM GOAL #2   Title  Patient will demonstrate improvement on Female Sexual Function Index (FSFI) from 23 to 46 demonstrating improved QOL with sexual functions.    Baseline  23/100- indicating high risk  for sexual dysfunction    Time  10    Period  Weeks    Status  New    Target Date  10/19/19      PT LONG TERM GOAL #3   Title  Patient will score less than or equal to 20% on the Female NIH-CPSI to demonstrate a reduction in pain, urinary symptoms, and an improved quality of life.    Baseline  34/44 (78%)    Time  10    Period  Weeks    Status  New    Target Date  10/19/19      PT LONG TERM GOAL #4   Title  Patient will describe pain no greater than 2/10 during ADL's, work, and following intercourse to demonstrate improved functional ability.    Baseline  7-8/10 on NPS at LBP, hips and vulva increased by above activities.    Time  10    Period  Weeks    Status  New    Target Date  10/19/19            Plan - 12/31/19 1104    Clinical Impression Statement  Pt. Responded well to all interventions today, demonstrating decreased adductor spasm/tenderness as well as understanding and correct performance of all education and exercises provided today. They will continue to benefit from skilled physical therapy to work toward remaining goals and maximize function as well as decrease likelihood of symptom increase or recurrence.     PT Next Visit Plan  Further internal PFM TP release to posterior PR/PC/IC/coccygeus, re-check for LLD when pelvic alignment is improved; up-grade deep-core/review posture    PT Home Exercise Plan  diaphragmatic breathing in seated and hook-lying; side stretch (R>L); seated  pelvic tilts; sleeping positions; R anterior innominate correction; bridges, supine and seated chin-tucks, child's pose with pillow under knees, scapular retractions, frog stretch, thoracic extensions over a towel, toe scrunches/arch crunches, calf stretch, calf TP release, hip ABD in side-lying.    Consulted and Agree with Plan of Care  Patient       Patient will benefit from skilled therapeutic intervention in order to improve the following deficits and impairments:     Visit Diagnosis: Other muscle spasm  Mixed incontinence  Abnormal posture  Other abnormalities of gait and mobility     Problem List Patient Active Problem List   Diagnosis Date Noted  . Interstitial cystitis 12/20/2019  . Generalized abdominal pain   . Rectum inflammation   . Internal hemorrhoids   . Diverticulosis of small intestine without hemorrhage   . Nausea and vomiting   . Lower abdominal pain 01/29/2019  . Diarrhea 01/29/2019  . GAD (generalized anxiety disorder) 01/29/2019  . Severe episode of recurrent major depressive disorder, without psychotic features (HCC) 01/29/2019  . Panic disorder with agoraphobia 06/19/2018   Cleophus Molt DPT, ATC Cleophus Molt 12/31/2019, 11:06 AM  [facility] MAIN [facility] SERVICES [address] [place], [place], [address] Phone: [phone]   Fax:  [phone]  Name: Olivia Sullivan MRN: [id number] Date of Birth: 1996/04/19

## 2019-12-30 NOTE — Patient Instructions (Signed)
  [employer] plantar fasciitis arch supports.    Hold on tender points, take deep belly breath until pain at least 50% reduced (100% is better if possible)    Hold for 5 deep breaths, repeat 2-3 times on each side, once per day.  Toe "scrunches"  Do 2x10, 1 time per day  Arch "crunches"   Do 2x10, 1 time per day    Hip Abduction: Side Leg Lift - Side-Lying    Lie on side. Draw lower tummy muscle (TA) and pelvic floor in, Lift top leg until you feel strong contraction of muscle on the side of the hip. Keep top leg straight with body, toes pointing forward. Do not let your hip roll back! Do _10__ reps per set, __3_ sets per day

## 2020-01-03 DIAGNOSIS — Z23 Encounter for immunization: Secondary | ICD-10-CM | POA: Diagnosis not present

## 2020-01-06 ENCOUNTER — Ambulatory Visit: Payer: BC Managed Care – PPO | Attending: Certified Nurse Midwife

## 2020-01-06 ENCOUNTER — Other Ambulatory Visit: Payer: Self-pay

## 2020-01-06 DIAGNOSIS — R2689 Other abnormalities of gait and mobility: Secondary | ICD-10-CM | POA: Insufficient documentation

## 2020-01-06 DIAGNOSIS — N3946 Mixed incontinence: Secondary | ICD-10-CM

## 2020-01-06 DIAGNOSIS — M62838 Other muscle spasm: Secondary | ICD-10-CM | POA: Diagnosis not present

## 2020-01-06 DIAGNOSIS — R293 Abnormal posture: Secondary | ICD-10-CM

## 2020-01-06 NOTE — Therapy (Addendum)
[place] MAIN [facility] SERVICES [address] [place], [place], [address] Phone: [phone]   Fax:  [phone]  Physical Therapy Progress Note   Dates of reporting period  08/10/19   to 12/31/19     The patient has been informed of current processes in place at Outpatient Rehab to protect patients from Covid-19 exposure including social distancing, schedule modifications, and new cleaning procedures. After discussing their particular risk with a therapist based on the patient's personal risk factors, the patient has decided to proceed with in-person therapy.   Patient Details  Name: Olivia Sullivan MRN: [id number] Date of Birth: 10-20-1995 Referring Provider (PT): Bluford Main CNM    Encounter Date: 01/06/2020  PT End of Session - 01/19/20 1320    Visit Number  11    Number of Visits  20    Date for PT Re-Evaluation  10/19/19    Authorization - Visit Number  10    Authorization - Number of Visits  10    Progress Note Due on Visit  10    PT Start Time  1630    PT Stop Time  1730    PT Time Calculation (min)  60 min    Activity Tolerance  Patient tolerated treatment well;No increased pain    Behavior During Therapy  WFL for tasks assessed/performed       Past Medical History:  Diagnosis Date  . Anxiety   . Depression   . Migraine     Past Surgical History:  Procedure Laterality Date  . COLONOSCOPY WITH PROPOFOL N/A 03/11/2019   Procedure: COLONOSCOPY WITH PROPOFOL;  Surgeon: Virgel Manifold, MD;  Location: [facility] ENDOSCOPY;  Service: Endoscopy;  Laterality: N/A;  . CYSTO WITH HYDRODISTENSION N/A 10/25/2019   Procedure: CYSTOSCOPY/HYDRODISTENSION;  Surgeon: Bjorn Loser, MD;  Location: [facility] ORS;  Service: Urology;  Laterality: N/A;  . ESOPHAGOGASTRODUODENOSCOPY (EGD) WITH PROPOFOL N/A 03/11/2019   Procedure: ESOPHAGOGASTRODUODENOSCOPY (EGD) WITH PROPOFOL;  Surgeon: Virgel Manifold, MD;  Location: [facility] ENDOSCOPY;  Service:  Endoscopy;  Laterality: N/A;  . NO PAST SURGERIES      There were no vitals filed for this visit.  Pelvic Floor Physical Therapy Treatment Note  SCREENING  Changes in medications, allergies, or medical history?: Discontinued the treatments that were aggravating her bladder.   SUBJECTIVE  Patient reports: Still having burning with urination, going to the bathroom once ~ every 2 hours.  Precautions:  N/A   Pain update:  Location of pain 1 :  (neck/R shoulder) Current pain:  2/10  Max pain: 5/10 (first thing in the morining  Least pain:  2/10  Nature of pain: sharp   Pain at vulva: Burning sensation: 4/10 on NPS (after urinating) Max:10/10 on NPS  Min: 0/10 - something is always present  Ice assists. Few times a week. After trying intercourse.   **less burning but some soreness following treatment  Location of pain 2 inner thigh Current pain: 0/10 on NPS  Max pain: 3/10 on NPS (when up and walking) Least: 0/10 on NPS (dull)    Patient Goals: To be pain free. The most aggravating is with urination.    OBJECTIVE  Changes in: Posture/Observations:  L up-slip, R anterior rotation. (from previous session)  Range of Motion/Flexibilty:   Strength/MMT: N/A   Pelvic Floor External Exam: (From previous session) [Introitus Appears: WNL Skin integrity: WNL Palpation: no TTP externally to PFM, R adductors>L TTP Cough: intact Prolapse visible?: no Scar mobility: N/A  Internal Vaginal  Exam: Strength (PERF): 3/5, 4 sec, 1  time  Symmetry: similar internally Palpation: TTP throughout with R anterior PR/PC re-creating burning/urgency sensation and burning at B posterior fourchette Prolapse: none]  Today: TTP to L>R PR posteriorly near introitus, OI, and IC on L.  Abdominal:    Palpation:  Gait Analysis: Pt not using eccentric control for feet upon arrival (from previous session)  INTERVENTIONS THIS SESSION: Manual Therapy: Performed TP release to L>R PR  posteriorly near introitus, OI, and IC on L and scar release near introitus on L as well to take pressure off of nerves and to decrease spasm and pain and allow for improved balance of musculature for improved function and decreased symptoms.   Self-care: Educated on where to purchase internal PFM self-release tool and how it is used to allow for continued decrease in pain and burning with bladder filling.  Total time: 60 minutes                             PT Short Term Goals - 01/19/20 1321      PT SHORT TERM GOAL #1   Title  Patient will demonstrate a coordinated contraction, relaxation, and bulge of the pelvic floor muscles to demonstrate functional recruitment and motion and allow for further strengthening.    Baseline  paradoxical TA activation with breathing    Time  5    Period  Weeks    Status  Achieved    Target Date  09/14/19      PT SHORT TERM GOAL #2   Title  Patient will demonstrate appropriate body mechanics with ambulation to allow for decreased stress on the pelvic floor and low back.    Baseline  antalgic, slow gait    Time  5    Period  Weeks    Status  On-going    Target Date  03/23/20      PT SHORT TERM GOAL #3   Title  Patient will report a reduction in pain to no greater than 5/10 over the prior week to demonstrate symptom improvement.    Baseline  7-8/10 on NPS at LBP, hips and vulva As of 4/8: burning no greater than 5/10 as low as 2/10, neck/shoulder pain are high.    Time  5    Period  Weeks    Status  On-going    Target Date  03/23/20        PT Long Term Goals - 01/19/20 1322      PT LONG TERM GOAL #1   Title  Patient will report no episodes of mixed UI over the course of the prior two weeks to demonstrate improved functional ability.    Baseline  mixed UI- urgency and stress leakage every day    Time  10    Period  Weeks    Status  Achieved    Target Date  10/19/19      PT LONG TERM GOAL #2   Title  Patient will  demonstrate improvement on Female Sexual Function Index (FSFI) from 23 to 46 demonstrating improved QOL with sexual functions.    Baseline  23/100- indicating high risk for sexual dysfunction As of 4/8: 19/100 demonstrating mild improvement.    Time  10    Period  Weeks    Status  On-going    Target Date  03/23/20      PT LONG TERM GOAL #3   Title  Patient  will score less than or equal to 20% on the Female NIH-CPSI to demonstrate a reduction in pain, urinary symptoms, and an improved quality of life.    Baseline  34/44 (78%) As of 4/8: 25/43 (58%)    Time  10    Period  Weeks    Status  On-going    Target Date  03/23/20      PT LONG TERM GOAL #4   Title  Patient will describe pain no greater than 2/10 during ADL's, work, and following intercourse to demonstrate improved functional ability.    Baseline  7-8/10 on NPS at LBP, hips and vulva increased by above activities. As of 4/8: Pt. demonstrates 1/10 pain in R hip, 4/10 in L inner thigh but decreased vulvar burning to 2/10. Her neck and shoulder are irritated from work related activities and are keeping the rest of the nervous system sensitized.    Time  10    Period  Weeks    Status  On-going    Target Date  03/23/20      PT LONG TERM GOAL #5   Title  Improve neck and shoulder mobility and strength to allow for improved posture and prevent return of distal pain/Sx. due to sensitization of nervous system from chronic pain.    Baseline  Has had chronic pain for ~ 10 years    Time  10    Period  Weeks    Status  New    Target Date  03/23/20            Plan - 01/19/20 1325    Clinical Impression Statement  Pt. has made slow but steady improvement, decreaseing from 78% to 58% disability on the Female NIH CPSI and having less urinary frequency and burning. She continues to have some burning, however, and has pain in her neck and shoulder that are sensitizing her nervous system and making it difficult for her PFM tension to release.  She will continue to benefit from skilled pelvic health PT to address the neck/shoulder pain while continuing to decreased PFM spasm and pain to achieve original goals and allow for maximal recovery and function as well as prevent future pain and dysfunction.    Personal Factors and Comorbidities  Behavior Pattern;Comorbidity 2;Past/Current Experience;Time since onset of injury/illness/exacerbation    Comorbidities  anxiety/depression/generalized personality/disorder; headaches/migraines.    Examination-Activity Limitations  Sit;Stand;Toileting;Continence;Hygiene/Grooming    Examination-Participation Restrictions  Interpersonal Relationship;Community Activity    Stability/Clinical Decision Making  Unstable/Unpredictable    Rehab Potential  Good    PT Frequency  1x / week    PT Duration  Other (comment)   10 weeks   PT Treatment/Interventions  ADLs/Self Care Home Management;Biofeedback;Electrical Stimulation;Moist Heat;Traction;Ultrasound;Stair training;Functional mobility training;Therapeutic activities;Gait training;Balance training;Therapeutic exercise;Neuromuscular re-education;Patient/family education;Manual techniques;Dry needling;Energy conservation;Spinal Manipulations;Joint Manipulations    PT Next Visit Plan  Further internal PFM TP release to posterior PR/PC/IC/coccygeus, re-check for LLD when pelvic alignment is improved; up-grade deep-core/review posture    PT Home Exercise Plan  diaphragmatic breathing in seated and hook-lying; side stretch (R>L); seated pelvic tilts; sleeping positions; R anterior innominate correction; bridges, supine and seated chin-tucks, child's pose with pillow under knees, scapular retractions, frog stretch, thoracic extensions over a towel, toe scrunches/arch crunches, calf stretch, calf TP release, hip ABD in side-lying.    Consulted and Agree with Plan of Care  Patient       Patient will benefit from skilled therapeutic intervention in order to improve the  following deficits and impairments:  Abnormal gait, Decreased activity tolerance, Decreased balance, Decreased coordination, Decreased endurance, Decreased mobility, Decreased range of motion, Decreased strength, Increased muscle spasms, Increased fascial restricitons, Postural dysfunction, Improper body mechanics, Pain  Visit Diagnosis: Other muscle spasm  Mixed incontinence  Abnormal posture  Other abnormalities of gait and mobility     Problem List Patient Active Problem List   Diagnosis Date Noted  . Interstitial cystitis 12/20/2019  . Generalized abdominal pain   . Rectum inflammation   . Internal hemorrhoids   . Diverticulosis of small intestine without hemorrhage   . Nausea and vomiting   . Lower abdominal pain 01/29/2019  . Diarrhea 01/29/2019  . GAD (generalized anxiety disorder) 01/29/2019  . Severe episode of recurrent major depressive disorder, without psychotic features (HCC) 01/29/2019  . Panic disorder with agoraphobia 06/19/2018   Cleophus Molt DPT, ATC Cleophus Molt 01/19/2020, 1:33 PM   [facility] MAIN [facility] SERVICES [address] [place], [place], [address] Phone: [phone]   Fax:  [phone]  Name: Olivia Sullivan MRN: [id number] Date of Birth: 1995/10/22

## 2020-01-06 NOTE — Patient Instructions (Signed)
[employer] internal trigger point release tool $29.99$59.99  Dr. Joel Kaplan Premium Prostate Massager   $19.99  

## 2020-01-11 IMAGING — CR DG PELVIS 1-2V
1 series · 1 of 1 positions shown · non-contrast
Comparison: None.

CLINICAL DATA: Concern for hip dysplasia

EXAM:
PELVIS - 1-2 VIEW

[dg pelvis 1-2 views]
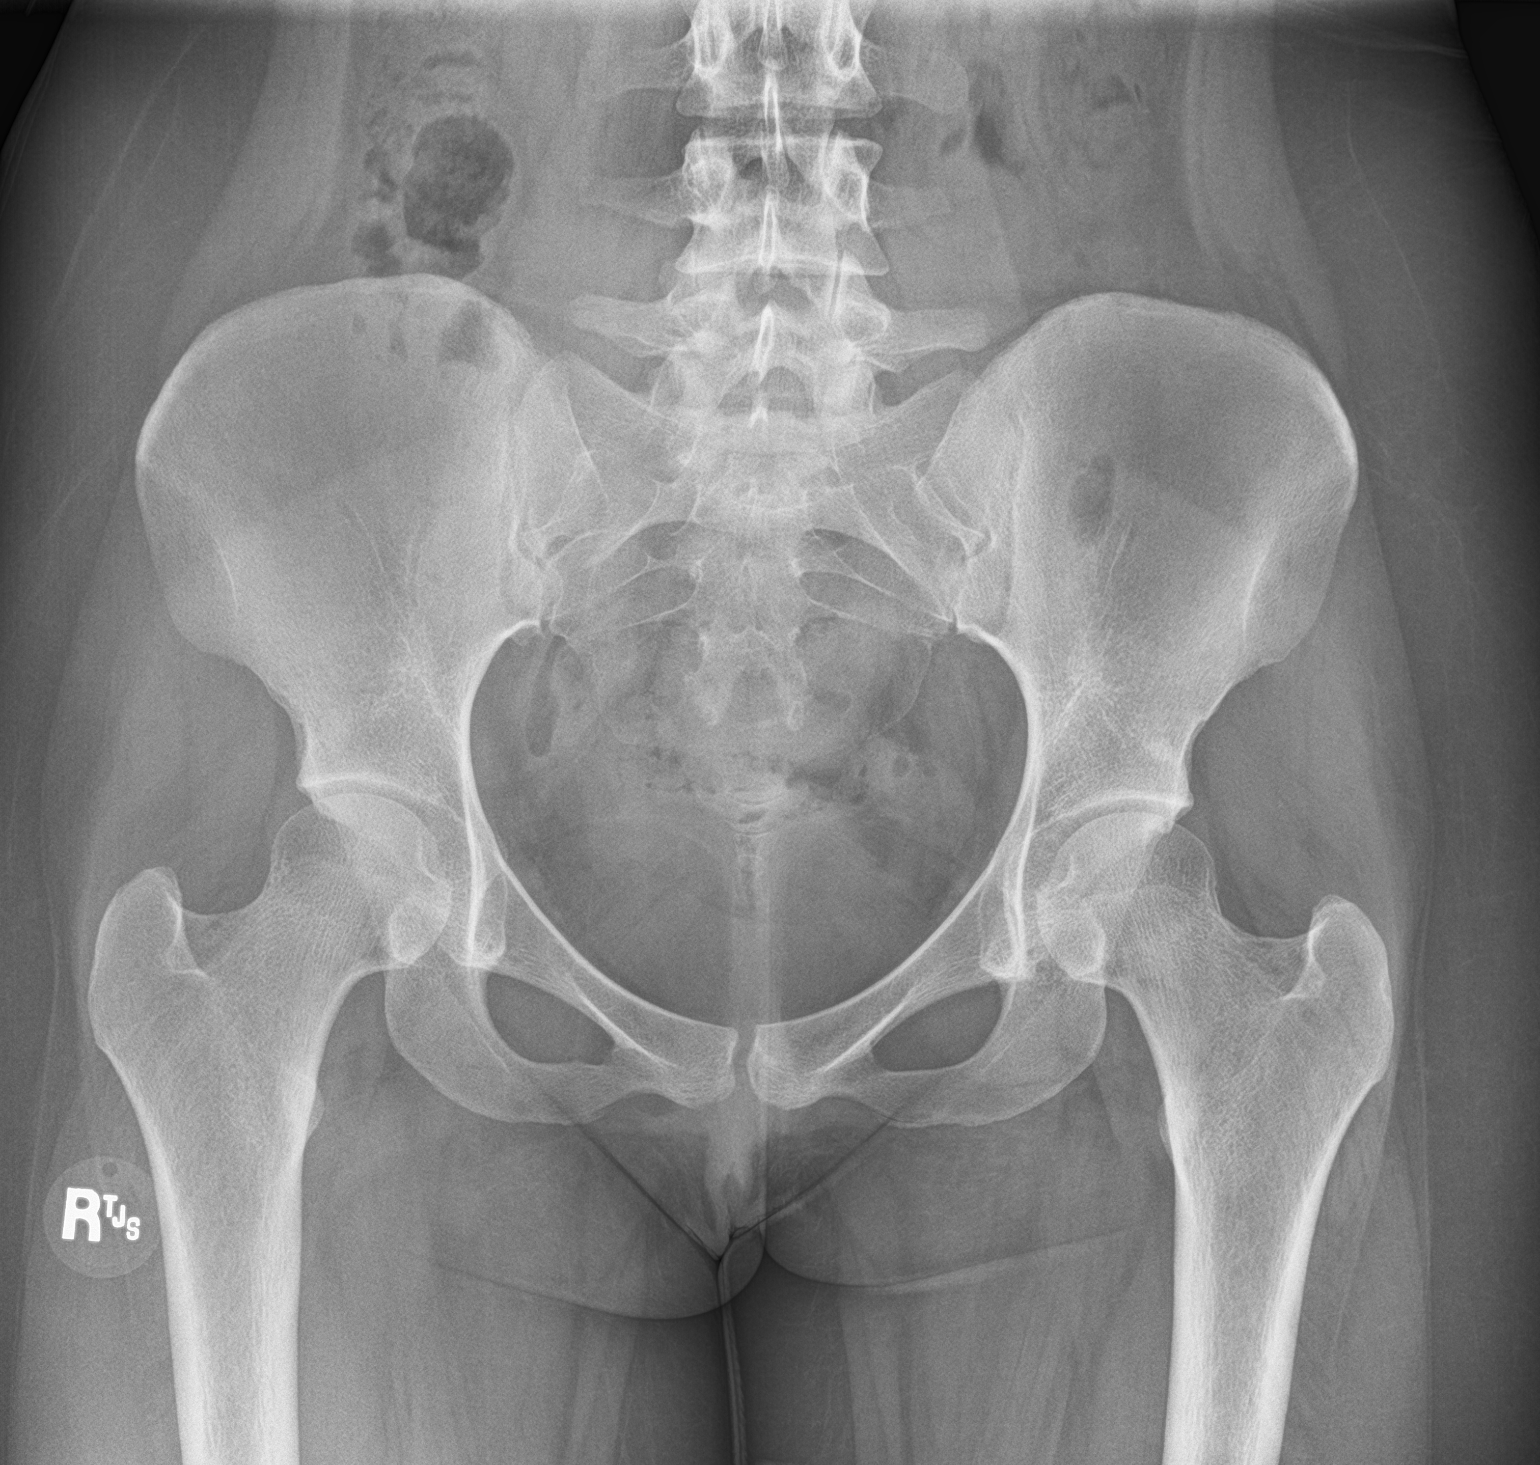

[1 of 1 positions shown; findings below may reference images not displayed]

FINDINGS: The hips appear developmentally normal with appropriate acetabular
coverage in normal formation of the articular surface of the femoral
heads. Bones of the pelvis are congruent. Femoral heads are normally
located. No suspicious osseous lesions. Soft tissues are
unremarkable.
IMPRESSION: Normal appearance of the pelvis and hips. No evidence of hip
dysplasia.

## 2020-01-13 ENCOUNTER — Other Ambulatory Visit: Payer: Self-pay

## 2020-01-13 ENCOUNTER — Ambulatory Visit: Payer: BC Managed Care – PPO

## 2020-01-13 DIAGNOSIS — N3946 Mixed incontinence: Secondary | ICD-10-CM

## 2020-01-13 DIAGNOSIS — M62838 Other muscle spasm: Secondary | ICD-10-CM

## 2020-01-13 DIAGNOSIS — R2689 Other abnormalities of gait and mobility: Secondary | ICD-10-CM | POA: Diagnosis not present

## 2020-01-13 DIAGNOSIS — R293 Abnormal posture: Secondary | ICD-10-CM

## 2020-01-13 NOTE — Therapy (Addendum)
[facility] MAIN [facility] SERVICES [address] [place], [place], [address] Phone: [phone]   Fax:  [phone]  Physical Therapy Treatment  The patient has been informed of current processes in place at Outpatient Rehab to protect patients from Covid-19 exposure including social distancing, schedule modifications, and new cleaning procedures. After discussing their particular risk with a therapist based on the patient's personal risk factors, the patient has decided to proceed with in-person therapy.   Patient Details  Name: Olivia Sullivan MRN: [id number] Date of Birth: 06/29/1996 Referring Provider (PT): Rose Phi CNM    Encounter Date: 01/13/2020  PT End of Session - 01/19/20 1536    Visit Number  12    Number of Visits  20    Date for PT Re-Evaluation  10/19/19    Authorization - Visit Number  2    Authorization - Number of Visits  10    PT Start Time  1630    PT Stop Time  1730    PT Time Calculation (min)  60 min    Activity Tolerance  Patient tolerated treatment well;No increased pain    Behavior During Therapy  WFL for tasks assessed/performed       Past Medical History:  Diagnosis Date  . Anxiety   . Depression   . Migraine     Past Surgical History:  Procedure Laterality Date  . COLONOSCOPY WITH PROPOFOL N/A 03/11/2019   Procedure: COLONOSCOPY WITH PROPOFOL;  Surgeon: [name], MD;  Location: [facility] ENDOSCOPY;  Service: Endoscopy;  Laterality: N/A;  . CYSTO WITH HYDRODISTENSION N/A 10/25/2019   Procedure: CYSTOSCOPY/HYDRODISTENSION;  Surgeon: Alfredo Martinez, MD;  Location: [facility] ORS;  Service: Urology;  Laterality: N/A;  . ESOPHAGOGASTRODUODENOSCOPY (EGD) WITH PROPOFOL N/A 03/11/2019   Procedure: ESOPHAGOGASTRODUODENOSCOPY (EGD) WITH PROPOFOL;  Surgeon: [name], MD;  Location: [facility] ENDOSCOPY;  Service: Endoscopy;  Laterality: N/A;  . NO PAST SURGERIES      There were no vitals filed for this  visit.  Pelvic Floor Physical Therapy Treatment Note  SCREENING  Changes in medications, allergies, or medical history?: Discontinued the treatments that were aggravating her bladder.   SUBJECTIVE  Patient reports: Burning is doing better but her neck/shoulder area is flared up and tense from work related activity/being really busy. Pt. Reports 60% improvement since beginning PT.  Precautions:  N/A   Pain update:  Location of pain 1 :  (neck/R shoulder) Current pain:  7/10  Max pain: 8/10 (first thing in the morning)  Least pain:  2/10  Nature of pain: sharp  **2/10 following treatment  Pain at vulva: Burning sensation: 2/10 on NPS (after urinating) Max:5/10 on NPS  Min: 0/10 - something is always present  Ice assists. Few times a week. After trying intercourse.   Location of pain 2 inner thigh (Right) Current pain: 0/10 on NPS  Max pain: 4/10 on NPS (when up and walking) Least: 0/10 on NPS (dull)     Patient Goals: To be pain free. The most aggravating is with urination.    OBJECTIVE  Changes in: Posture/Observations:   Range of Motion/Flexibilty:   Strength/MMT: N/A   Pelvic Floor External Exam: (From previous session) [Introitus Appears: WNL Skin integrity: WNL Palpation: no TTP externally to PFM, R adductors>L TTP Cough: intact Prolapse visible?: no Scar mobility: N/A  Internal Vaginal Exam: Strength (PERF): 3/5, 4 sec, 1  time  Symmetry: similar internally Palpation: TTP throughout with R anterior PR/PC re-creating burning/urgency sensation and burning  at B posterior fourchette Prolapse: none]  Abdominal:    Palpation: TTP to  upper trap, levator scap, cervical extensors and middle scalenes  Gait Analysis: Pt not using eccentric control for feet upon arrival (from prior visit)  INTERVENTIONS THIS SESSION: Manual: Performed TP release to R upper trap, levator scap, cervical extensors and middle scalenes followed by grade 3 mobs to C7  and 1st rib to decrease spasm and pain and allow for improved balance of musculature for improved function and decreased symptoms.  Self-care: reviewed and updated goals for POC development.    Total time: 60 minutes                             PT Short Term Goals - 01/19/20 1321      PT SHORT TERM GOAL #1   Title  Patient will demonstrate a coordinated contraction, relaxation, and bulge of the pelvic floor muscles to demonstrate functional recruitment and motion and allow for further strengthening.    Baseline  paradoxical TA activation with breathing    Time  5    Period  Weeks    Status  Achieved    Target Date  09/14/19      PT SHORT TERM GOAL #2   Title  Patient will demonstrate appropriate body mechanics with ambulation to allow for decreased stress on the pelvic floor and low back.    Baseline  antalgic, slow gait    Time  5    Period  Weeks    Status  On-going    Target Date  03/23/20      PT SHORT TERM GOAL #3   Title  Patient will report a reduction in pain to no greater than 5/10 over the prior week to demonstrate symptom improvement.    Baseline  7-8/10 on NPS at LBP, hips and vulva As of 4/8: burning no greater than 5/10 as low as 2/10, neck/shoulder pain are high.    Time  5    Period  Weeks    Status  On-going    Target Date  03/23/20        PT Long Term Goals - 01/19/20 1322      PT LONG TERM GOAL #1   Title  Patient will report no episodes of mixed UI over the course of the prior two weeks to demonstrate improved functional ability.    Baseline  mixed UI- urgency and stress leakage every day    Time  10    Period  Weeks    Status  Achieved    Target Date  10/19/19      PT LONG TERM GOAL #2   Title  Patient will demonstrate improvement on Female Sexual Function Index (FSFI) from 23 to 46 demonstrating improved QOL with sexual functions.    Baseline  23/100- indicating high risk for sexual dysfunction As of 4/8: 19/100  demonstrating mild improvement.    Time  10    Period  Weeks    Status  On-going    Target Date  03/23/20      PT LONG TERM GOAL #3   Title  Patient will score less than or equal to 20% on the Female NIH-CPSI to demonstrate a reduction in pain, urinary symptoms, and an improved quality of life.    Baseline  34/44 (78%) As of 4/8: 25/43 (58%)    Time  10    Period  Weeks  Status  On-going    Target Date  03/23/20      PT LONG TERM GOAL #4   Title  Patient will describe pain no greater than 2/10 during ADL's, work, and following intercourse to demonstrate improved functional ability.    Baseline  7-8/10 on NPS at LBP, hips and vulva increased by above activities. As of 4/8: Pt. demonstrates 1/10 pain in R hip, 4/10 in L inner thigh but decreased vulvar burning to 2/10. Her neck and shoulder are irritated from work related activities and are keeping the rest of the nervous system sensitized.    Time  10    Period  Weeks    Status  On-going    Target Date  03/23/20      PT LONG TERM GOAL #5   Title  Improve neck and shoulder mobility and strength to allow for improved posture and prevent return of distal pain/Sx. due to sensitization of nervous system from chronic pain.    Baseline  Has had chronic pain for ~ 10 years    Time  10    Period  Weeks    Status  New    Target Date  03/23/20            Plan - 01/19/20 1536    Clinical Impression Statement  Pt. Responded well to all interventions today, demonstrating decreased spasm and decreased neck/shoulder pain from 7/10 to 2/10 as well as understanding and correct performance of all education and exercises provided today. They will continue to benefit from skilled physical therapy to work toward remaining goals and maximize function as well as decrease likelihood of symptom increase or recurrence.     PT Next Visit Plan  Further internal PFM TP release to (R>L) posterior PR/PC/IC/coccygeus, re-check shoulder/neck, re-check for LLD  when pelvic alignment is improved; up-grade deep-core/review posture    PT Home Exercise Plan  diaphragmatic breathing in seated and hook-lying; side stretch (R>L); seated pelvic tilts; sleeping positions; R anterior innominate correction; bridges, supine and seated chin-tucks, child's pose with pillow under knees, scapular retractions, frog stretch, thoracic extensions over a towel, toe scrunches/arch crunches, calf stretch, calf TP release, hip ABD in side-lying.    Consulted and Agree with Plan of Care  Patient       Patient will benefit from skilled therapeutic intervention in order to improve the following deficits and impairments:     Visit Diagnosis: Other muscle spasm  Mixed incontinence  Abnormal posture     Problem List Patient Active Problem List   Diagnosis Date Noted  . Interstitial cystitis 12/20/2019  . Generalized abdominal pain   . Rectum inflammation   . Internal hemorrhoids   . Diverticulosis of small intestine without hemorrhage   . Nausea and vomiting   . Lower abdominal pain 01/29/2019  . Diarrhea 01/29/2019  . GAD (generalized anxiety disorder) 01/29/2019  . Severe episode of recurrent major depressive disorder, without psychotic features (HCC) 01/29/2019  . Panic disorder with agoraphobia 06/19/2018   Cleophus Molt DPT, ATC Cleophus Molt 01/19/2020, 3:50 PM  [place] [facility] MAIN [facility] SERVICES [address] [place], [place], [address] Phone: [phone]   Fax:  [phone]  Name: Olivia Sullivan MRN: [id number] Date of Birth: [birth year]

## 2020-01-19 NOTE — Addendum Note (Signed)
Addended by: Flora Lipps T on: 01/19/2020 01:36 PM   Modules accepted: Orders

## 2020-01-20 ENCOUNTER — Ambulatory Visit: Payer: BC Managed Care – PPO

## 2020-01-25 DIAGNOSIS — Z23 Encounter for immunization: Secondary | ICD-10-CM | POA: Diagnosis not present

## 2020-02-23 ENCOUNTER — Other Ambulatory Visit: Payer: Self-pay

## 2020-02-23 ENCOUNTER — Ambulatory Visit: Payer: BC Managed Care – PPO | Attending: Certified Nurse Midwife

## 2020-02-23 DIAGNOSIS — R2689 Other abnormalities of gait and mobility: Secondary | ICD-10-CM

## 2020-02-23 DIAGNOSIS — M62838 Other muscle spasm: Secondary | ICD-10-CM | POA: Insufficient documentation

## 2020-02-23 DIAGNOSIS — R293 Abnormal posture: Secondary | ICD-10-CM | POA: Diagnosis not present

## 2020-02-23 DIAGNOSIS — N3946 Mixed incontinence: Secondary | ICD-10-CM

## 2020-02-23 NOTE — Therapy (Addendum)
[facility] MAIN [facility] SERVICES [address] [place], [place], [address] Phone: [phone]   Fax:  [phone]  Physical Therapy Treatment  The patient has been informed of current processes in place at Outpatient Rehab to protect patients from Covid-19 exposure including social distancing, schedule modifications, and new cleaning procedures. After discussing their particular risk with a therapist based on the patient's personal risk factors, the patient has decided to proceed with in-person therapy.   Patient Details  Name: Olivia Sullivan MRN: [id number] Date of Birth: 1996-03-19 Referring Provider (PT): Rose Phi CNM    Encounter Date: 02/23/2020  PT End of Session - 02/29/20 1016    Visit Number  14    Number of Visits  20    Date for PT Re-Evaluation  10/19/19    Authorization - Visit Number  4    Authorization - Number of Visits  10    Progress Note Due on Visit  10    PT Start Time  1740    PT Stop Time  1840    PT Time Calculation (min)  60 min    Activity Tolerance  Patient tolerated treatment well;No increased pain    Behavior During Therapy  WFL for tasks assessed/performed       Past Medical History:  Diagnosis Date  . Anxiety   . Depression   . Migraine     Past Surgical History:  Procedure Laterality Date  . COLONOSCOPY WITH PROPOFOL N/A 03/11/2019   Procedure: COLONOSCOPY WITH PROPOFOL;  Surgeon: [name], MD;  Location: [facility] ENDOSCOPY;  Service: Endoscopy;  Laterality: N/A;  . CYSTO WITH HYDRODISTENSION N/A 10/25/2019   Procedure: CYSTOSCOPY/HYDRODISTENSION;  Surgeon: Alfredo Martinez, MD;  Location: [facility] ORS;  Service: Urology;  Laterality: N/A;  . ESOPHAGOGASTRODUODENOSCOPY (EGD) WITH PROPOFOL N/A 03/11/2019   Procedure: ESOPHAGOGASTRODUODENOSCOPY (EGD) WITH PROPOFOL;  Surgeon: [name], MD;  Location: [facility] ENDOSCOPY;  Service: Endoscopy;  Laterality: N/A;  . NO PAST SURGERIES      There  were no vitals filed for this visit.   Pelvic Floor Physical Therapy Treatment Note  SCREENING  Changes in medications, allergies, or medical history?:    SUBJECTIVE  Patient reports: No back or hip pain. Not much burning other than one day which was bad. It is still there just not as bad.  Precautions:  N/A   Pain update:  Location of pain 1 :  (neck/R shoulder) Current pain:  2/10  Max pain: 6/10 (first thing in the morning)  Least pain:  0/10  Nature of pain: sharp  **not addressed today  Pain at vulva: Burning sensation: 0/10 on NPS (after urinating) Max:9/10 on NPS  Min: 0/10  Nature of pain: still worse after urination for ~ 30 min to 1 hr.  **0/10 following treatment  Location of pain 2 inner thigh (Right) Current pain: 0/10 on NPS  Max pain: 4/10 on NPS (when up and walking) Least: 0/10 on NPS (dull)   ** not addressed this session  Patient Goals: To be pain free. The most aggravating is with urination.    OBJECTIVE  Changes in: Posture/Observations:  L ASIS and PSIS slightly high (very little)  Range of Motion/Flexibilty:   Strength/MMT: N/A   Pelvic Floor External Exam: (From previous session) [Introitus Appears: WNL Skin integrity: WNL Palpation: no TTP externally to PFM, R adductors>L TTP Cough: intact Prolapse visible?: no Scar mobility: N/A  Internal Vaginal Exam: Strength (PERF): 3/5, 4 sec, 1  time  Symmetry: similar internally Palpation: TTP throughout with R anterior PR/PC re-creating burning/urgency sensation and burning at B posterior fourchette Prolapse: none]  Today:  TTP to to all PFM on the R with the greatest restriction at the posterior PR/PC/IC   Abdominal:    Palpation:  Gait Analysis: Pt not using eccentric control for feet upon arrival (from prior visit)  INTERVENTIONS THIS SESSION: Manual: Performed TP release to all PFM on the R with the greatest restriction at the posterior PR/PC/IC to decrease spasm  and pain and allow for improved balance of musculature for improved function and decreased symptoms.  Total time: 53 minutes                             PT Short Term Goals - 01/19/20 1321      PT SHORT TERM GOAL #1   Title  Patient will demonstrate a coordinated contraction, relaxation, and bulge of the pelvic floor muscles to demonstrate functional recruitment and motion and allow for further strengthening.    Baseline  paradoxical TA activation with breathing    Time  5    Period  Weeks    Status  Achieved    Target Date  09/14/19      PT SHORT TERM GOAL #2   Title  Patient will demonstrate appropriate body mechanics with ambulation to allow for decreased stress on the pelvic floor and low back.    Baseline  antalgic, slow gait    Time  5    Period  Weeks    Status  On-going    Target Date  03/23/20      PT SHORT TERM GOAL #3   Title  Patient will report a reduction in pain to no greater than 5/10 over the prior week to demonstrate symptom improvement.    Baseline  7-8/10 on NPS at LBP, hips and vulva As of 4/8: burning no greater than 5/10 as low as 2/10, neck/shoulder pain are high.    Time  5    Period  Weeks    Status  On-going    Target Date  03/23/20        PT Long Term Goals - 01/19/20 1322      PT LONG TERM GOAL #1   Title  Patient will report no episodes of mixed UI over the course of the prior two weeks to demonstrate improved functional ability.    Baseline  mixed UI- urgency and stress leakage every day    Time  10    Period  Weeks    Status  Achieved    Target Date  10/19/19      PT LONG TERM GOAL #2   Title  Patient will demonstrate improvement on Female Sexual Function Index (FSFI) from 23 to 46 demonstrating improved QOL with sexual functions.    Baseline  23/100- indicating high risk for sexual dysfunction As of 4/8: 19/100 demonstrating mild improvement.    Time  10    Period  Weeks    Status  On-going    Target Date   03/23/20      PT LONG TERM GOAL #3   Title  Patient will score less than or equal to 20% on the Female NIH-CPSI to demonstrate a reduction in pain, urinary symptoms, and an improved quality of life.    Baseline  34/44 (78%) As of 4/8: 25/43 (58%)    Time  10  Period  Weeks    Status  On-going    Target Date  03/23/20      PT LONG TERM GOAL #4   Title  Patient will describe pain no greater than 2/10 during ADL's, work, and following intercourse to demonstrate improved functional ability.    Baseline  7-8/10 on NPS at LBP, hips and vulva increased by above activities. As of 4/8: Pt. demonstrates 1/10 pain in R hip, 4/10 in L inner thigh but decreased vulvar burning to 2/10. Her neck and shoulder are irritated from work related activities and are keeping the rest of the nervous system sensitized.    Time  10    Period  Weeks    Status  On-going    Target Date  03/23/20      PT LONG TERM GOAL #5   Title  Improve neck and shoulder mobility and strength to allow for improved posture and prevent return of distal pain/Sx. due to sensitization of nervous system from chronic pain.    Baseline  Has had chronic pain for ~ 10 years    Time  10    Period  Weeks    Status  New    Target Date  03/23/20            Plan - 02/29/20 1017    Clinical Impression Statement  Pt. Responded well to all interventions today, demonstrating improved relaxation of the R PFM and resolution or resting level burning as well as understanding and correct performance of all education and exercises provided today. They will continue to benefit from skilled physical therapy to work toward remaining goals and maximize function as well as decrease likelihood of symptom increase or recurrence.     PT Next Visit Plan  Re-assess PFM and perform Further internal PFM TP release to (L>R) posterior PR/PC/IC/coccygeus, re-check shoulder/neck, re-check for LLD when pelvic alignment is improved; up-grade deep-core/review posture     PT Home Exercise Plan  diaphragmatic breathing in seated and hook-lying; side stretch (R>L); seated pelvic tilts; sleeping positions; R anterior innominate correction; bridges, supine and seated chin-tucks, child's pose with pillow under knees, scapular retractions, frog stretch, thoracic extensions over a towel, toe scrunches/arch crunches, calf stretch, calf TP release, hip ABD in side-lying.    Consulted and Agree with Plan of Care  Patient       Patient will benefit from skilled therapeutic intervention in order to improve the following deficits and impairments:     Visit Diagnosis: Other muscle spasm  Mixed incontinence  Abnormal posture  Other abnormalities of gait and mobility     Problem List Patient Active Problem List   Diagnosis Date Noted  . Interstitial cystitis 12/20/2019  . Generalized abdominal pain   . Rectum inflammation   . Internal hemorrhoids   . Diverticulosis of small intestine without hemorrhage   . Nausea and vomiting   . Lower abdominal pain 01/29/2019  . Diarrhea 01/29/2019  . GAD (generalized anxiety disorder) 01/29/2019  . Severe episode of recurrent major depressive disorder, without psychotic features (HCC) 01/29/2019  . Panic disorder with agoraphobia 06/19/2018   Cleophus Molt DPT, ATC Cleophus Molt 02/29/2020, 10:20 AM  [place] [facility] MAIN [facility] SERVICES [address] [place], [place], [address] Phone: [phone]   Fax:  [phone]  Name: Olivia Sullivan MRN: [id number] Date of Birth: [birth year]

## 2020-03-01 ENCOUNTER — Ambulatory Visit: Payer: BC Managed Care – PPO

## 2020-03-08 ENCOUNTER — Ambulatory Visit: Payer: BC Managed Care – PPO

## 2020-03-15 ENCOUNTER — Ambulatory Visit: Payer: BC Managed Care – PPO

## 2020-03-22 ENCOUNTER — Ambulatory Visit: Payer: BC Managed Care – PPO

## 2020-03-29 ENCOUNTER — Ambulatory Visit: Payer: BC Managed Care – PPO

## 2020-05-22 ENCOUNTER — Other Ambulatory Visit: Payer: Self-pay

## 2020-05-22 ENCOUNTER — Other Ambulatory Visit: Payer: BC Managed Care – PPO

## 2020-05-22 DIAGNOSIS — Z20822 Contact with and (suspected) exposure to covid-19: Secondary | ICD-10-CM | POA: Diagnosis not present

## 2020-05-23 LAB — NOVEL CORONAVIRUS, NAA: SARS-CoV-2, NAA: NOT DETECTED

## 2020-05-23 LAB — SARS-COV-2, NAA 2 DAY TAT

## 2020-07-04 ENCOUNTER — Ambulatory Visit: Payer: Self-pay | Admitting: *Deleted

## 2020-07-04 ENCOUNTER — Ambulatory Visit: Payer: Self-pay

## 2020-07-04 NOTE — Telephone Encounter (Signed)
Patient called stating that she has already spoke with a triage nurse today for dizziness and passing out once last Wednesday 9/22. She states that she was sent to [facility] for evaluation but they would not see her and said she needed to go to ER for evaluation.  She called stating that she can not go to ER because she has no insurance that covers ER visits. Per patient she has been lightheaded.  Her symptoms come and go. She states symptoms are worse after eating. She has palpitations HR now 100. She has Diarrhea but has IBS She feels fatigued. She passed out for a few seconds last Wednesday. Her hands feel tingly at times.  She is unsure what is causing her to feel lightheaded. She states that in the past she has had low iron levels. She states that she was told in the past that she shows pre diabetes. She is requesting blood work.  Per protocol office was notified for permission to schedule patient with a provider since [facility] would not see her for her issues and was sending her to ER. Virtual  My Chart visit scheduled tomorrow. Care advice given to patient to include if she passes out call 911 and go to ER.  Reason for Disposition . [1] MODERATE dizziness (e.g., interferes with normal activities) AND [2] has NOT been evaluated by physician for this  (Exception: dizziness caused by heat exposure, sudden standing, or poor fluid intake)  Answer Assessment - Initial Assessment Questions 1. DESCRIPTION: "Describe your dizziness."     lightheaded 2. LIGHTHEADED: "Do you feel lightheaded?" (e.g., somewhat faint, woozy, weak upon standing)     Fainted passed out 3. VERTIGO: "Do you feel like either you or the room is spinning or tilting?" (i.e. vertigo)     No 4. SEVERITY: "How bad is it?"  "Do you feel like you are going to faint?" "Can you stand and walk?"   - MILD: Feels slightly dizzy, but walking normally.   - MODERATE: Feels very unsteady when walking, but not falling; interferes with normal activities (e.g.,  school, work) .   - SEVERE: Unable to walk without falling, or requires assistance to walk without falling; feels like passing out now.      Fine now symptom comes and goes 5. ONSET:  "When did the dizziness begin?"     Last wednesday 6. AGGRAVATING FACTORS: "Does anything make it worse?" (e.g., standing, change in head position)     Worse after eating 7. HEART RATE: "Can you tell me your heart rate?" "How many beats in 15 seconds?"  (Note: not all patients can do this)       100 8. CAUSE: "What do you think is causing the dizziness?"     Pre diabetes low iron levels 9. RECURRENT SYMPTOM: "Have you had dizziness before?" If Yes, ask: "When was the last time?" "What happened that time?"     no 10. OTHER SYMPTOMS: "Do you have any other symptoms?" (e.g., fever, chest pain, vomiting, diarrhea, bleeding)      Fatigue, tingling in hands 11. PREGNANCY: "Is there any chance you are pregnant?" "When was your last menstrual period?"     No  9/23 last cycle  Protocols used: DIZZINESS [facility]

## 2020-07-04 NOTE — Telephone Encounter (Signed)
Patient called with complaints of having palpitations and she passed out on 06/28/20, and they have worsened; she is also complains of headache fatigue dizziness blurred vision and tingling down to her hands that are associated with her palpitations; recommendations made per nurse triage protocol; she verbalized understanding; the pt is seen at [facility]; will route to office for notification of encounter.  Reason for Disposition  Dizziness, lightheadedness, or weakness  Answer Assessment - Initial Assessment Questions 1. DESCRIPTION: "Please describe your heart rate or heartbeat that you are having" (e.g., fast/slow, regular/irregular, skipped or extra beats, "palpitations")     palpitations 2. ONSET: "When did it start?" (Minutes, hours or days)      06/28/20 3. DURATION: "How long does it last" (e.g., seconds, minutes, hours)     5 min 4. PATTERN "Does it come and go, or has it been constant since it started?"  "Does it get worse with exertion?"   "Are you feeling it now?"     No; happens without exertion but worsen with exertion 5. TAP: "Using your hand, can you tap out what you are feeling on a chair or table in front of you, so that I can hear?" (Note: not all patients can do this)        6. HEART RATE: "Can you tell me your heart rate?" "How many beats in 15 seconds?"  (Note: not all patients can do this)       28 beats in 15 seconds 7. RECURRENT SYMPTOM: "Have you ever had this before?" If Yes, ask: "When was the last time?" and "What happened that time?"     Yes related to anxiety 8. CAUSE: "What do you think is causing the palpitations?"     Not sure 9. CARDIAC HISTORY: "Do you have any history of heart disease?" (e.g., heart attack, angina, bypass surgery, angioplasty, arrhythmia)      no 10. OTHER SYMPTOMS: "Do you have any other symptoms?" (e.g., dizziness, chest pain, sweating, difficulty breathing)     Dizziness, headache, nausea,  Fatigue, blurred vision, tingling in arms  down to hands 11. PREGNANCY: "Is there any chance you are pregnant?" "When was your last menstrual period?"      No LMP 06/29/20  Protocols used: HEART RATE AND HEARTBEAT QUESTIONS-A-AH

## 2020-07-05 ENCOUNTER — Telehealth (INDEPENDENT_AMBULATORY_CARE_PROVIDER_SITE_OTHER): Payer: BC Managed Care – PPO | Admitting: Internal Medicine

## 2020-07-05 ENCOUNTER — Other Ambulatory Visit: Payer: Self-pay

## 2020-07-05 ENCOUNTER — Encounter: Payer: Self-pay | Admitting: Internal Medicine

## 2020-07-05 DIAGNOSIS — R42 Dizziness and giddiness: Secondary | ICD-10-CM | POA: Diagnosis not present

## 2020-07-05 DIAGNOSIS — R002 Palpitations: Secondary | ICD-10-CM | POA: Diagnosis not present

## 2020-07-05 DIAGNOSIS — K589 Irritable bowel syndrome without diarrhea: Secondary | ICD-10-CM

## 2020-07-05 DIAGNOSIS — R5383 Other fatigue: Secondary | ICD-10-CM

## 2020-07-05 DIAGNOSIS — F411 Generalized anxiety disorder: Secondary | ICD-10-CM

## 2020-07-05 DIAGNOSIS — N3010 Interstitial cystitis (chronic) without hematuria: Secondary | ICD-10-CM

## 2020-07-05 NOTE — Progress Notes (Signed)
Name: Olivia Sullivan   MRN: [id number]    DOB: 11/07/1995   Date:07/05/2020       Progress Note  Subjective  Chief Complaint  Chief Complaint  Patient presents with  . Dizziness    passed out on 06/28/20, patient states this has happened to her in the past, she had a Doctor tell her once she had low iron and wanted to monitor her sugars   . hands tingling    I connected with  Bethena Roys on 07/05/20 at  2:20 PM EDT by telephone and verified that I am speaking with the correct person using two identifiers.  I discussed the limitations, risks, security and privacy concerns of performing an evaluation and management service by telephone and the availability of in person appointments. The patient expressed understanding and agreed to proceed. Staff also discussed with the patient that there may be a patient responsible charge related to this service. Patient Location: work [employer]) Provider Location: [facility] Additional Individuals present: none  HPI  Patient is a 24 year old female former patient of Maurice Small Last visit at [facility] was 09/23/2019 with Irving Burton She called yesterday and the following message was noted:  Note Patient called stating that she has already spoke with a triage nurse today for dizziness and passing out once last Wednesday 9/22. She states that she was sent to [facility] for evaluation but they would not see her and said she needed to go to ER for evaluation.  She called stating that she can not go to ER because she has no insurance that covers ER visits. Per patient she has been lightheaded.  Her symptoms come and go. She states symptoms are worse after eating. She has palpitations HR now 100. She has Diarrhea but has IBS She feels fatigued. She passed out for a few seconds last Wednesday. Her hands feel tingly at times.  She is unsure what is causing her to feel lightheaded. She states that in the past she has had low iron levels. She states that she was told in the past that she  shows pre diabetes. She is requesting blood work.  Per protocol office was notified for permission to schedule patient with a provider since [facility] would not see her for her issues and was sending her to ER. Virtual  My Chart visit scheduled tomorrow. Care advice given to patient to include if she passes out call 911 and go to ER.      She follows up today with a phone visit. I noted the limitations of this phone visit.   She notes intermittent lightheadedness more than frank dizzy spells that have been occurring over the past 2 weeks, and stated she passed out for a few seconds last week, mid week.  She feels worse after eating, usually 30 minutes after, and in the morning when gets up, and at times will get increased heart rate feelings with the lightheadedness, not check pulse.   No SOB, At times nausea, no vomiting. She also has intermittent loose bowel movements, and has a history of IBS.  Has been feeling more fatigued.  At times she will get some tingling feeling in her hands, only happened one time,  but denies any numbness or tingling otherwise in the extremities. The lightheadedness usually lasts about an hour as does the nausea, then goes away, not take anything to help.  No new meds or new supps Only med taking is OCP - on for about a year. No chance is pregnant  as not sexually active No h/o thyroid disease She may have been anemic in the past, and notes that she has had lower iron levels when younger. Periods are regular, not heavy. No increased pain, no dysuria - does have IC- diagnosed this year Left work yesterday due to nausea, not vomit. No loss of taste or smell, no fevers, no cough   Patient Active Problem List   Diagnosis Date Noted  . Interstitial cystitis 12/20/2019  . Generalized abdominal pain   . Rectum inflammation   . Internal hemorrhoids   . Diverticulosis of small intestine without hemorrhage   . Nausea and vomiting   . Lower abdominal pain 01/29/2019  . Diarrhea  01/29/2019  . GAD (generalized anxiety disorder) 01/29/2019  . Severe episode of recurrent major depressive disorder, without psychotic features (HCC) 01/29/2019  . Panic disorder with agoraphobia 06/19/2018    Past Surgical History:  Procedure Laterality Date  . COLONOSCOPY WITH PROPOFOL N/A 03/11/2019   Procedure: COLONOSCOPY WITH PROPOFOL;  Surgeon: [name], MD;  Location: [facility] ENDOSCOPY;  Service: Endoscopy;  Laterality: N/A;  . CYSTO WITH HYDRODISTENSION N/A 10/25/2019   Procedure: CYSTOSCOPY/HYDRODISTENSION;  Surgeon: Alfredo Martinez, MD;  Location: [facility] ORS;  Service: Urology;  Laterality: N/A;  . ESOPHAGOGASTRODUODENOSCOPY (EGD) WITH PROPOFOL N/A 03/11/2019   Procedure: ESOPHAGOGASTRODUODENOSCOPY (EGD) WITH PROPOFOL;  Surgeon: [name], MD;  Location: [facility] ENDOSCOPY;  Service: Endoscopy;  Laterality: N/A;  . NO PAST SURGERIES      Family History  Problem Relation Age of Onset  . Diabetes Father   . Depression Father   . Anxiety disorder Father   . Depression Sister   . Anxiety disorder Sister   . Irritable bowel syndrome Sister   . Depression Brother   . Anxiety disorder Brother   . Irritable bowel syndrome Brother   . Diabetes Maternal Grandmother   . Endometriosis Maternal Grandmother   . Endometriosis Maternal Aunt   . Breast cancer Neg Hx   . Ovarian cancer Neg Hx   . Colon cancer Neg Hx     Social History   Tobacco Use  . Smoking status: Never Smoker  . Smokeless tobacco: Never Used  Substance Use Topics  . Alcohol use: Yes    Comment: occasionally     Current Outpatient Medications:  .  NORETHINDRONE PO, Take by mouth., Disp: , Rfl:  .  Probiotic Product (PROBIOTIC DAILY PO), Take 1 capsule by mouth daily., Disp: , Rfl:   Allergies  Allergen Reactions  . Latex Rash    With staff assistance, above reviewed with the patient today.  ROS: As per HPI, otherwise no specific complaints on a limited and focused system review    Objective  Virtual encounter, vitals not obtained.  There is no height or weight on file to calculate BMI.  Physical Exam   Appears in NAD via conversation Pulmonary/Chest: No obvious respiratory distress. Speaking in complete sentences Neurological: Pt is alert, Speech is normal Psychiatric: Patient has a normal mood and affect, behavior is normal. Judgment and thought content normal.   No results found for this or any previous visit (from the past 72 hour(s)).  PHQ2/9: Depression screen [facility] 2/9 07/05/2020 09/23/2019 05/17/2019 02/16/2019 01/29/2019  Decreased Interest 0 1 2 2 2   Down, Depressed, Hopeless 0 1 1 2 2   PHQ - 2 Score 0 2 3 4 4   Altered sleeping - 3 3 3 3   Tired, decreased energy - 3 3 3 2   Change in appetite -  1 2 3 2   Feeling bad or failure about yourself  - 1 1 3 3   Trouble concentrating - 3 3 3 2   Moving slowly or fidgety/restless - 1 0 3 2  Suicidal thoughts - 0 1 2 2   PHQ-9 Score - 14 16 24 20   Difficult doing work/chores - Somewhat difficult Somewhat difficult Extremely dIfficult Very difficult   PHQ-2/9 Result reviewed  Fall Risk: Fall Risk  07/05/2020 09/23/2019 05/17/2019 02/16/2019 01/29/2019  Falls in the past year? 0 0 0 0 0  Number falls in past yr: 0 0 0 0 0  Injury with Fall? 0 0 0 0 0  Follow up Falls evaluation completed Falls evaluation completed Falls evaluation completed Falls evaluation completed Falls evaluation completed     Assessment & Plan 1. Intermittent lightheadedness Discussed with patient the limitations with this being a phone visit and the importance of getting some lab test to help as part of the work-up.  She agreed to follow-up and be seen, and will try to schedule her for tomorrow, as she notes she cannot make it on Fridays. There does seem to be some relationship with meals, namely about 30 minutes after eating, and she also has a history of low iron in the past.  Unclear if it was iron deficiency anemia. Also with a history  of irritable bowel and interstitial cystitis. Await her assessment in person, and will have lab tests as well to help further assess.  2. Palpitations She notes that often her heart does go fast when she has symptoms, although noted does not happen all the time, and when it does, it lasts only a minute or 2.  Denies any chest pains in association.  3. Fatigue, unspecified type Has been feeling more fatigued in the recent past also noted.  4. Interstitial cystitis This was diagnosed this past year.  5. Irritable bowel syndrome, unspecified type She stated she has a history of irritable bowel  6. GAD (generalized anxiety disorder) Also noted in her past with a history of generalized anxiety disorder.  Await her follow-up tomorrow for an in person assessment and talk with Melissa to help in the scheduling.  I discussed the assessment and treatment plan with the patient. The patient was provided an opportunity to ask questions and all were answered. The patient agreed with the plan and demonstrated an understanding of the instructions.   The patient was advised to call back or seek an in-person evaluation if the symptoms worsen or if the condition fails to improve as anticipated.  I provided 15 minutes of non-face-to-face time during this encounter that included discussing at length patient's sx/history, pertinent pmhx, medications, treatment and follow up plan. This time also included the necessary documentation, orders, and chart review.  09/25/2019, MD

## 2020-07-05 NOTE — Progress Notes (Signed)
Patient ID: Olivia Sullivan, female    DOB: 07-27-1996, 24 y.o.   MRN: [id number]  PCP: Doren Custard, FNP  Chief Complaint  Patient presents with  . Dizziness    Subjective:   Olivia Sullivan is a 24 y.o. female, presents to clinic with CC of the following:  Chief Complaint  Patient presents with  . Dizziness    HPI:  Patient is a 24 year old female former patient of Maurice Small I had an initial phone visit with her yesterday, and arrange for a follow-up in person visit today She follows up today.  She called Tuesday and the following message was noted: Note Patient called stating that she has already spoke with a triage nurse today for dizziness and passing out once last Wednesday 9/22. She states that she was sent to [facility] for evaluation but they would not see her and said she needed to go to ER for evaluation. She called stating that she can not go to ER because she has no insurance that covers ER visits. Per patient she has been lightheaded. Her symptoms come and go. She states symptoms are worse after eating. She has palpitations HR now 100. She has Diarrhea but has IBS She feels fatigued. She passed out for a few seconds last Wednesday. Her hands feel tingly at times. She is unsure what is causing her to feel lightheaded. She states that in the past she has had low iron levels. She states that she was told in the past that she shows pre diabetes. She is requesting blood work. Per protocol office was notified for permission to schedule patient with a provider since [facility] would not see her for her issues and was sending her to ER. Virtual My Chart visit scheduled tomorrow. Care advice given to patient to include if she passes out call 911 and go to ER.       She notes intermittent lightheadedness more than frank dizzy spells that have been occurring over the past2weeks, and stated she passed out for a few seconds last week, mid week. She feels worse after eating,  usually 30 minutes after, and in the morning when gets up,and at times will get increased heart rate feelings with the lightheadedness, not check pulse. No SOB, At times nausea, no vomiting. She also has intermittent loose bowel movements, and has a history of IBS. Has been feeling more fatigued. Notes has been for past year. At times she will get some tingling feeling in her hands, only happened one time,but denies any numbness or tingling otherwise in the extremities. The lightheadedness usually lasts about an hour as does the nausea, then goes away, not take anything to help.  She noted that after our phone call yesterday, she had another episode this morning of lightheadedness, shortly after eating, and went away after about an hour.  Still no room spinning or dizziness component.  No marked nausea or vomiting and she did go to work, and has felt fine at work today. No new meds or new supps Only med taking is OCP - on for about a year. No chance is pregnant as not sexually active No h/o thyroid disease She may have been anemic in the past, and notes that she has had lower iron levelswhen younger. Periods are regular, not heavy. No increased pain, no dysuria - does have IC- diagnosed this year Left work yesterday due to nausea, not vomit. No loss of taste or smell, no fevers, no cough  She  did note today she has a history of anxiety, and wondered if that could be a factor as well.  She states she has not felt overly anxious in the recent past.  Patient Active Problem List   Diagnosis Date Noted  . Intermittent lightheadedness 07/05/2020  . Interstitial cystitis 12/20/2019  . Generalized abdominal pain   . Rectum inflammation   . Internal hemorrhoids   . Diverticulosis of small intestine without hemorrhage   . Nausea and vomiting   . Lower abdominal pain 01/29/2019  . Diarrhea 01/29/2019  . GAD (generalized anxiety disorder) 01/29/2019  . Severe episode of recurrent major  depressive disorder, without psychotic features (HCC) 01/29/2019  . Panic disorder with agoraphobia 06/19/2018      Current Outpatient Medications:  .  NORETHINDRONE PO, Take by mouth., Disp: , Rfl:  .  Probiotic Product (PROBIOTIC DAILY PO), Take 1 capsule by mouth daily., Disp: , Rfl:    Allergies  Allergen Reactions  . Latex Rash     Past Surgical History:  Procedure Laterality Date  . COLONOSCOPY WITH PROPOFOL N/A 03/11/2019   Procedure: COLONOSCOPY WITH PROPOFOL;  Surgeon: [name], MD;  Location: [facility] ENDOSCOPY;  Service: Endoscopy;  Laterality: N/A;  . CYSTO WITH HYDRODISTENSION N/A 10/25/2019   Procedure: CYSTOSCOPY/HYDRODISTENSION;  Surgeon: Alfredo Martinez, MD;  Location: [facility] ORS;  Service: Urology;  Laterality: N/A;  . ESOPHAGOGASTRODUODENOSCOPY (EGD) WITH PROPOFOL N/A 03/11/2019   Procedure: ESOPHAGOGASTRODUODENOSCOPY (EGD) WITH PROPOFOL;  Surgeon: [name], MD;  Location: [facility] ENDOSCOPY;  Service: Endoscopy;  Laterality: N/A;  . NO PAST SURGERIES       Family History  Problem Relation Age of Onset  . Diabetes Father   . Depression Father   . Anxiety disorder Father   . Depression Sister   . Anxiety disorder Sister   . Irritable bowel syndrome Sister   . Depression Brother   . Anxiety disorder Brother   . Irritable bowel syndrome Brother   . Diabetes Maternal Grandmother   . Endometriosis Maternal Grandmother   . Endometriosis Maternal Aunt   . Breast cancer Neg Hx   . Ovarian cancer Neg Hx   . Colon cancer Neg Hx      Social History   Tobacco Use  . Smoking status: Never Smoker  . Smokeless tobacco: Never Used  Substance Use Topics  . Alcohol use: Yes    Comment: occasionally    With staff assistance, above reviewed with the patient today.  ROS: As per HPI, otherwise no specific complaints on a limited and focused system review   No results found for this or any previous visit (from the past 72  hour(s)).   PHQ2/9: Depression screen [facility] 2/9 07/06/2020 07/05/2020 09/23/2019 05/17/2019 02/16/2019  Decreased Interest 0 0 1 2 2   Down, Depressed, Hopeless 0 0 1 1 2   PHQ - 2 Score 0 0 2 3 4   Altered sleeping - - 3 3 3   Tired, decreased energy - - 3 3 3   Change in appetite - - 1 2 3   Feeling bad or failure about yourself  - - 1 1 3   Trouble concentrating - - 3 3 3   Moving slowly or fidgety/restless - - 1 0 3  Suicidal thoughts - - 0 1 2  PHQ-9 Score - - 14 16 24   Difficult doing work/chores - - Somewhat difficult Somewhat difficult Extremely dIfficult   PHQ-2/9 Result is neg  Fall Risk: Fall Risk  07/05/2020 09/23/2019 05/17/2019 02/16/2019 01/29/2019  Falls  in the past year? 0 0 0 0 0  Number falls in past yr: 0 0 0 0 0  Injury with Fall? 0 0 0 0 0  Follow up Falls evaluation completed Falls evaluation completed Falls evaluation completed Falls evaluation completed Falls evaluation completed      Objective:   Vitals:   07/06/20 1336  BP: 110/74  Pulse: 89  Resp: 16  Temp: 97.8 F (36.6 C)  TempSrc: Oral  SpO2: 98%  Weight: 156 lb 14.4 oz (71.2 kg)    Body mass index is 26.93 kg/m. Not orthostatic on checks Physical Exam   NAD, masked, pleasant, not ill-appearing HEENT - [place]/AT, sclera anicteric, PERRL, EOMI, conj - non-inj'ed, TM's and canals clear, pharynx clear Neck - supple, no adenopathy, no TM, carotids 2+ and = without bruits bilat Car - RRR without m/g/r Pulm- RR and effort normal at rest, CTA without wheeze or rales Abd - soft, NT, ND, BS+,  no masses Back - no CVA tenderness Skin- no rash noted on exposed areas,  Ext - no LE edema Neuro/psychiatric - affect was not flat, appropriate with conversation  Alert and oriented  Grossly non-focal - good strength on testing extremities, sensation intact to LT in distal extremities, DTRs 2+ and equal in the patella, Romberg negative, no pronator drift, good balance on 1 foot, good finger-to-nose, good tandem  walk  Speech and gait are normal   Results for orders placed or performed in visit on 05/22/20  Novel Coronavirus, NAA ([facility])   Specimen: Nasopharyngeal(NP) swabs in vial transport medium   Nasopharynge  Screenin  Result Value Ref Range   SARS-CoV-2, NAA Not Detected Not Detected  SARS-COV-2, NAA 2 DAY TAT   Nasopharynge  Screenin  Result Value Ref Range   SARS-CoV-2, NAA 2 DAY TAT Performed        Assessment & Plan:   1. Intermittent lightheadedness/nausea 2. Palpitations Exact source unclear.  Does seem to be more lightheadedness than dizzy spells.  Not orthostatic on assessment today.  Discussed the many things in the differential that can be sources or contributors And do feel getting some laboratory studies will be helpful.  Did include a pregnancy test for completeness sake, and she was okay with that. Recommended staying well-hydrated as she is doing.  - Iron, TIBC and Ferritin Panel - Thyroid Panel With TSH - COMPLETE METABOLIC PANEL WITH GFR   3. Fatigue, unspecified type She notes she has been fatigued for the past year, with a potential slight increase with her more recent symptoms Await lab results checked initially today. She asked if vitamin D deficiency could be the source of this, and noted that can be in the differential, and offered to add this test and she wanted to do so. - Iron, TIBC and Ferritin Panel - Thyroid Panel With TSH - CBC with Differential/Platelet - COMPLETE METABOLIC PANEL WITH GFR - POCT urine pregnancy - VITAMIN D 25 Hydroxy (Vit-D Deficiency, Fractures)  4. Irritable bowel syndrome, unspecified type Has a history of this with no change in symptoms in the recent past  5. Interstitial cystitis This was diagnosed this past year, and denies any increased symptoms in the recent past  6. GAD (generalized anxiety disorder) She did note she has a history of anxiety, although does not think she has had increased symptoms in the recent  past  7. History of iron deficiency Noted she does have a history of this previously, and we will add an iron panel as  part of her lab test today.   Await results of the above, and further follow-up pending those results.    Jamelle HaringLIFFORD D Vanity Larsson, MD 07/06/20 1:49 PM

## 2020-07-06 ENCOUNTER — Other Ambulatory Visit: Payer: Self-pay

## 2020-07-06 ENCOUNTER — Ambulatory Visit (INDEPENDENT_AMBULATORY_CARE_PROVIDER_SITE_OTHER): Payer: BC Managed Care – PPO | Admitting: Internal Medicine

## 2020-07-06 ENCOUNTER — Encounter: Payer: Self-pay | Admitting: Internal Medicine

## 2020-07-06 ENCOUNTER — Ambulatory Visit: Payer: BC Managed Care – PPO | Admitting: Internal Medicine

## 2020-07-06 VITALS — BP 110/74 | HR 89 | Temp 97.8°F | Resp 16 | Wt 156.9 lb

## 2020-07-06 DIAGNOSIS — Z8639 Personal history of other endocrine, nutritional and metabolic disease: Secondary | ICD-10-CM | POA: Insufficient documentation

## 2020-07-06 DIAGNOSIS — K589 Irritable bowel syndrome without diarrhea: Secondary | ICD-10-CM

## 2020-07-06 DIAGNOSIS — R5383 Other fatigue: Secondary | ICD-10-CM

## 2020-07-06 DIAGNOSIS — R002 Palpitations: Secondary | ICD-10-CM

## 2020-07-06 DIAGNOSIS — N3010 Interstitial cystitis (chronic) without hematuria: Secondary | ICD-10-CM

## 2020-07-06 DIAGNOSIS — R110 Nausea: Secondary | ICD-10-CM

## 2020-07-06 DIAGNOSIS — R42 Dizziness and giddiness: Secondary | ICD-10-CM | POA: Diagnosis not present

## 2020-07-06 DIAGNOSIS — F411 Generalized anxiety disorder: Secondary | ICD-10-CM

## 2020-07-06 LAB — POCT URINE PREGNANCY: Preg Test, Ur: NEGATIVE

## 2020-07-07 LAB — CBC WITH DIFFERENTIAL/PLATELET
Absolute Monocytes: 479 cells/uL (ref 200–950)
Basophils Absolute: 42 cells/uL (ref 0–200)
Basophils Relative: 0.5 %
Eosinophils Absolute: 17 cells/uL (ref 15–500)
Eosinophils Relative: 0.2 %
HCT: 43 % (ref 35.0–45.0)
Hemoglobin: 13.8 g/dL (ref 11.7–15.5)
Lymphs Abs: 2470 cells/uL (ref 850–3900)
MCH: 28.3 pg (ref 27.0–33.0)
MCHC: 32.1 g/dL (ref 32.0–36.0)
MCV: 88.1 fL (ref 80.0–100.0)
MPV: 9.9 fL (ref 7.5–12.5)
Monocytes Relative: 5.7 %
Neutro Abs: 5393 cells/uL (ref 1500–7800)
Neutrophils Relative %: 64.2 %
Platelets: 436 10*3/uL — ABNORMAL HIGH (ref 140–400)
RBC: 4.88 10*6/uL (ref 3.80–5.10)
RDW: 12.1 % (ref 11.0–15.0)
Total Lymphocyte: 29.4 %
WBC: 8.4 10*3/uL (ref 3.8–10.8)

## 2020-07-07 LAB — COMPLETE METABOLIC PANEL WITH GFR
AG Ratio: 1.7 (calc) (ref 1.0–2.5)
ALT: 44 U/L — ABNORMAL HIGH (ref 6–29)
AST: 33 U/L — ABNORMAL HIGH (ref 10–30)
Albumin: 4.6 g/dL (ref 3.6–5.1)
Alkaline phosphatase (APISO): 129 U/L — ABNORMAL HIGH (ref 31–125)
BUN: 15 mg/dL (ref 7–25)
CO2: 26 mmol/L (ref 20–32)
Calcium: 9.8 mg/dL (ref 8.6–10.2)
Chloride: 105 mmol/L (ref 98–110)
Creat: 0.8 mg/dL (ref 0.50–1.10)
GFR, Est African American: 120 mL/min/{1.73_m2} (ref >=60)
GFR, Est Non African American: 104 mL/min/{1.73_m2} (ref >=60)
Globulin: 2.7 g/dL (calc) (ref 1.9–3.7)
Glucose, Bld: 92 mg/dL (ref 65–99)
Potassium: 5.2 mmol/L (ref 3.5–5.3)
Sodium: 142 mmol/L (ref 135–146)
Total Bilirubin: 0.5 mg/dL (ref 0.2–1.2)
Total Protein: 7.3 g/dL (ref 6.1–8.1)

## 2020-07-07 LAB — IRON,TIBC AND FERRITIN PANEL
%SAT: 40 % (calc) (ref 16–45)
Ferritin: 32 ng/mL (ref 16–154)
Iron: 148 ug/dL (ref 40–190)
TIBC: 371 mcg/dL (calc) (ref 250–450)

## 2020-07-07 LAB — THYROID PANEL WITH TSH
Free Thyroxine Index: 3.3 (ref 1.4–3.8)
T3 Uptake: 27 % (ref 22–35)
T4, Total: 12.4 ug/dL — ABNORMAL HIGH (ref 5.1–11.9)
TSH: 1.22 mIU/L

## 2020-07-07 LAB — VITAMIN D 25 HYDROXY (VIT D DEFICIENCY, FRACTURES): Vit D, 25-Hydroxy: 16 ng/mL — ABNORMAL LOW (ref 30–100)

## 2020-08-01 DIAGNOSIS — L301 Dyshidrosis [pompholyx]: Secondary | ICD-10-CM | POA: Diagnosis not present

## 2020-08-01 DIAGNOSIS — L700 Acne vulgaris: Secondary | ICD-10-CM | POA: Diagnosis not present

## 2020-08-01 NOTE — Progress Notes (Signed)
Patient ID: Olivia Sullivan, female    DOB: November 12, 1995, 24 y.o.   MRN: [id number]  PCP: Hubbard Hartshorn, FNP  Chief Complaint  Patient presents with  . Dizziness    Subjective:   Olivia Sullivan is a 24 y.o. female, presents to clinic with CC of the following:  Chief Complaint  Patient presents with  . Dizziness    HPI:  Patient is a 24 year old female former patient of Raelyn Ensign Follows up after our 07/06/2020 in person visit, after a phone visit the day prior She was seen for lightheadedness/palpitations and fatigue. Communication from the lab results obtained after that visit was as follows:  The lab results from yesterday were reviewed The complete blood count showed no evidence of anemia with a normal white count and differential. The iron panel including a ferritin were normal The thyroid panel was normal with the only exception being a slightly elevated total T4. There was no evidence of hypothyroidism. I think it is best to repeat this in about 4 to 6 weeks and include a free T4 level which can be more helpful to ensure there is no thyroid concerns. The complete metabolic panel was normal with the only exception being very slight increases in the liver enzymes (AST and ALT) and the alkaline phosphatase. They were just out of the normal ranges, and the significance of this is unclear. This could be related to some intermittent symptoms with the IBS, and I do think repeating those again in about 4 weeks time combined with monitoring clinically for increasing symptoms as appropriate. A vitamin D level was low at 16, consistent with vitamin D deficiency. Obtaining an over-the-counter supplement with 1000 international units of vitamin D to take daily is recommended presently. As we discussed yesterday, it is unclear how much this can contribute to fatigue symptoms. I do think a follow-up in 4 weeks time will be helpful and will ask Melissa to help get this arranged. Please  follow-up sooner if more concerning symptoms are arising as needed  Patient notes that since last visit, she had been doing better, without significant lightheaded or dizzy symptoms until she started minocycline by derm for acne Monday and since has noted some similar lightheaded sx's recurring. No room spinning dizziness.  Mild nausea without vomiting.  No fevers or infectious symptoms with no loss of taste or smell.  Does get occasional headaches.  Still working as a Warehouse manager, and has not been limiting with her work at all.  No chest pain or shortness of breath.  Fatigue was questionably a little better as well.  No numbness or tingling in the extremities.  No one-sided symptoms of concern.  No bad abdominal pains, and she does have the IBS history as well as the IC history.  Remains on an oral contraceptive. No other new medications or supplements. Denies increased stress or anxiety in the recent past.  She does have a history of some anxiety issues in the past.  Reviewed the prior lab results with her again today.  She did purchase a vitamin D supplement to take, although she has not taken too regularly here in the recent past.       Patient Active Problem List   Diagnosis Date Noted  . History of iron deficiency 07/06/2020  . Irritable bowel syndrome 07/06/2020  . Intermittent lightheadedness 07/05/2020  . Interstitial cystitis 12/20/2019  . Generalized abdominal pain   . Rectum inflammation   . Internal hemorrhoids   .  Diverticulosis of small intestine without hemorrhage   . Nausea and vomiting   . Lower abdominal pain 01/29/2019  . Diarrhea 01/29/2019  . GAD (generalized anxiety disorder) 01/29/2019  . Severe episode of recurrent major depressive disorder, without psychotic features ([place]) 01/29/2019  . Panic disorder with agoraphobia 06/19/2018      Current Outpatient Medications:  .  augmented betamethasone dipropionate (DIPROLENE-AF) 0.05 % cream, Apply topically 2  (two) times daily., Disp: , Rfl:  .  minocycline (MINOCIN) 100 MG capsule, Take 100 mg by mouth daily., Disp: , Rfl:  .  NORETHINDRONE PO, Take by mouth., Disp: , Rfl:  .  tretinoin (RETIN-A) 0.025 % cream, SMARTSIG:Sparingly Topical Every Other Day, Disp: , Rfl:    Allergies  Allergen Reactions  . Latex Rash     Past Surgical History:  Procedure Laterality Date  . COLONOSCOPY WITH PROPOFOL N/A 03/11/2019   Procedure: COLONOSCOPY WITH PROPOFOL;  Surgeon: Virgel Manifold, MD;  Location: [facility];  Service: Endoscopy;  Laterality: N/A;  . CYSTO WITH HYDRODISTENSION N/A 10/25/2019   Procedure: CYSTOSCOPY/HYDRODISTENSION;  Surgeon: Bjorn Loser, MD;  Location: [facility];  Service: Urology;  Laterality: N/A;  . ESOPHAGOGASTRODUODENOSCOPY (EGD) WITH PROPOFOL N/A 03/11/2019   Procedure: ESOPHAGOGASTRODUODENOSCOPY (EGD) WITH PROPOFOL;  Surgeon: Virgel Manifold, MD;  Location: [facility];  Service: Endoscopy;  Laterality: N/A;  . NO PAST SURGERIES       Family History  Problem Relation Age of Onset  . Diabetes Father   . Depression Father   . Anxiety disorder Father   . Depression Sister   . Anxiety disorder Sister   . Irritable bowel syndrome Sister   . Depression Brother   . Anxiety disorder Brother   . Irritable bowel syndrome Brother   . Diabetes Maternal Grandmother   . Endometriosis Maternal Grandmother   . Endometriosis Maternal Aunt   . Breast cancer Neg Hx   . Ovarian cancer Neg Hx   . Colon cancer Neg Hx      Social History   Tobacco Use  . Smoking status: Never Smoker  . Smokeless tobacco: Never Used  Substance Use Topics  . Alcohol use: Yes    Comment: occasionally    With staff assistance, above reviewed with the patient today.  ROS: As per HPI, otherwise no specific complaints on a limited and focused system review   No results found for this or any previous visit (from the past 72 hour(s)).   PHQ2/9: Depression screen [facility] 2/9  08/03/2020 07/06/2020 07/05/2020 09/23/2019 05/17/2019  Decreased Interest 0 0 0 1 2  Down, Depressed, Hopeless 0 0 0 1 1  PHQ - 2 Score 0 0 0 2 3  Altered sleeping - - - 3 3  Tired, decreased energy - - - 3 3  Change in appetite - - - 1 2  Feeling bad or failure about yourself  - - - 1 1  Trouble concentrating - - - 3 3  Moving slowly or fidgety/restless - - - 1 0  Suicidal thoughts - - - 0 1  PHQ-9 Score - - - 14 16  Difficult doing work/chores - - - Somewhat difficult Somewhat difficult   PHQ-2/9 Result reviewed  Fall Risk: Fall Risk  08/03/2020 07/05/2020 09/23/2019 05/17/2019 02/16/2019  Falls in the past year? 0 0 0 0 0  Number falls in past yr: 0 0 0 0 0  Injury with Fall? 0 0 0 0 0  Follow up - Falls evaluation completed Falls  evaluation completed Falls evaluation completed Falls evaluation completed      Objective:   Vitals:   08/03/20 1442  BP: 110/70  Pulse: 97  Resp: 16  Temp: 98.3 F (36.8 C)  TempSrc: Oral  SpO2: 98%  Weight: 158 lb 11.2 oz (72 kg)  Height: 5' 4"  (1.626 m)    Body mass index is 27.24 kg/m.  Physical Exam   NAD, masked, pleasant, not ill-appearing HEENT - [place]/AT, sclera anicteric, PERRL, EOMI, conj - non-inj'ed,  pharynx clear Neck - supple, no adenopathy, no TM,  Car - RRR without m/g/r Pulm- RR and effort normal at rest, CTA without wheeze or rales Abd - soft, NT diffusely, ND,  Back - no CVA tenderness Skin- no rash noted on exposed areas,  Ext - no LE edema Neuro/psychiatric - affect was not flat, appropriate with conversation             Alert with normal speech             Grossly non-focal - good strength on testing extremities, sensation intact to LT in distal extremities,              Results for orders placed or performed in visit on 07/06/20  Iron, TIBC and Ferritin Panel  Result Value Ref Range   Iron 148 40 - 190 mcg/dL   TIBC 371 250 - 450 mcg/dL (calc)   %SAT 40 16 - 45 % (calc)   Ferritin 32 16 - 154 ng/mL  Thyroid  Panel With TSH  Result Value Ref Range   T3 Uptake 27 22 - 35 %   T4, Total 12.4 (H) 5.1 - 11.9 mcg/dL   Free Thyroxine Index 3.3 1.4 - 3.8   TSH 1.22 mIU/L  CBC with Differential/Platelet  Result Value Ref Range   WBC 8.4 3.8 - 10.8 Thousand/uL   RBC 4.88 3.80 - 5.10 Million/uL   Hemoglobin 13.8 11.7 - 15.5 g/dL   HCT 43.0 35 - 45 %   MCV 88.1 80.0 - 100.0 fL   MCH 28.3 27.0 - 33.0 pg   MCHC 32.1 32.0 - 36.0 g/dL   RDW 12.1 11.0 - 15.0 %   Platelets 436 (H) 140 - 400 Thousand/uL   MPV 9.9 7.5 - 12.5 fL   Neutro Abs 5,393 1,500 - 7,800 cells/uL   Lymphs Abs 2,470 850 - 3,900 cells/uL   Absolute Monocytes 479 200 - 950 cells/uL   Eosinophils Absolute 17 15.0 - 500.0 cells/uL   Basophils Absolute 42 0.0 - 200.0 cells/uL   Neutrophils Relative % 64.2 %   Total Lymphocyte 29.4 %   Monocytes Relative 5.7 %   Eosinophils Relative 0.2 %   Basophils Relative 0.5 %  COMPLETE METABOLIC PANEL WITH GFR  Result Value Ref Range   Glucose, Bld 92 65 - 99 mg/dL   BUN 15 7 - 25 mg/dL   Creat 0.80 0.50 - 1.10 mg/dL   GFR, Est Non African American 104 > OR = 60 mL/min/1.39m   GFR, Est African American 120 > OR = 60 mL/min/1.721m  BUN/Creatinine Ratio NOT APPLICABLE 6 - 22 (calc)   Sodium 142 135 - 146 mmol/L   Potassium 5.2 3.5 - 5.3 mmol/L   Chloride 105 98 - 110 mmol/L   CO2 26 20 - 32 mmol/L   Calcium 9.8 8.6 - 10.2 mg/dL   Total Protein 7.3 6.1 - 8.1 g/dL   Albumin 4.6 3.6 - 5.1 g/dL   Globulin 2.7  1.9 - 3.7 g/dL (calc)   AG Ratio 1.7 1.0 - 2.5 (calc)   Total Bilirubin 0.5 0.2 - 1.2 mg/dL   Alkaline phosphatase (APISO) 129 (H) 31 - 125 U/L   AST 33 (H) 10 - 30 U/L   ALT 44 (H) 6 - 29 U/L  VITAMIN D 25 Hydroxy (Vit-D Deficiency, Fractures)  Result Value Ref Range   Vit D, 25-Hydroxy 16 (L) 30 - 100 ng/mL  POCT urine pregnancy  Result Value Ref Range   Preg Test, Ur Negative Negative   Recent labs reviewed, mildly increased liver tests noted, also with a low vitamin D level,  and the total T4 being slightly elevated.    Assessment & Plan:    1. Fatigue, unspecified type 2. Palpitations 3. Intermittent lightheadedness Symptoms seem to be improving, and then since starting the minocycline product by dermatology on Monday, she has felt more lightheaded symptoms again, and thinks it could be due to the medication as she noted it is one of the side effects.  Has not had spinning dizziness, nor marked palpitations in association. She has been staying well-hydrated, and emphasized the importance of continuing that. Felt best to check some further labs today, with a free T4 and TSH ordered to help ensure that thyroid function is stable.  Clinically appears euthyroid.   Continue to closely monitor.  4. Irritable bowel syndrome, unspecified type Has a history this, with no increased symptoms of concern in the recent past noted.  5. Interstitial cystitis Also with a history of this, and no increased symptoms recently.  6. GAD (generalized anxiety disorder) Has a history of anxiety in the past, although denies increased anxiety or stressors here more recently.  7. Abnormal liver function tests Had slight elevation of the transaminases and the alk phos, with the exact source unclear. Felt best checking a hepatic function panel today to help reassess. - Hepatic function panel   1. Intermittent lightheadedness/nausea 2. Palpitations Exact source unclear.  Does seem to be more lightheadedness than dizzy spells.  Not orthostatic on assessment today.  Discussed the many things in the differential that can be sources or contributors And do feel getting some laboratory studies will be helpful.  Did include a pregnancy test for completeness sake, and she was okay with that. Recommended staying well-hydrated as she is doing.  - Iron, TIBC and Ferritin Panel - Thyroid Panel With TSH - COMPLETE METABOLIC PANEL WITH GFR   3. Fatigue, unspecified type She notes she has  been fatigued for the past year, with a potential slight increase with her more recent symptoms Await lab results checked initially today. She asked if vitamin D deficiency could be the source of this, and noted that can be in the differential, and offered to add this test and she wanted to do so. - Iron, TIBC and Ferritin Panel - Thyroid Panel With TSH - CBC with Differential/Platelet - COMPLETE METABOLIC PANEL WITH GFR - POCT urine pregnancy - VITAMIN D 25 Hydroxy (Vit-D Deficiency, Fractures)  4. Irritable bowel syndrome, unspecified type Has a history of this with no change in symptoms in the recent past  5. Interstitial cystitis This was diagnosed this past year, and denies any increased symptoms in the recent past  6. GAD (generalized anxiety disorder) She did note she has a history of anxiety, although does not think she has had increased symptoms in the recent past  7. History of iron deficiency Noted she does have a history of this previously,  and we will add an iron panel as part of her lab test today.  Await the lab results today, and emphasized the importance of following up if symptoms more problematic, or other symptoms in association arising. She was understanding of this. She also noted her dermatologist may consider Accutane in the future if the minocycline is not effective, as she has a cystic acne concern noted.  She would need a letter from [place] stating she is not severely depressed in order to start the Accutane, and will follow up with [place] if and when that need arises.  Towanda Malkin, MD 08/03/20 3:01 PM

## 2020-08-03 ENCOUNTER — Encounter: Payer: Self-pay | Admitting: Internal Medicine

## 2020-08-03 ENCOUNTER — Ambulatory Visit (INDEPENDENT_AMBULATORY_CARE_PROVIDER_SITE_OTHER): Payer: BC Managed Care – PPO | Admitting: Internal Medicine

## 2020-08-03 ENCOUNTER — Other Ambulatory Visit: Payer: Self-pay

## 2020-08-03 VITALS — BP 110/70 | HR 97 | Temp 98.3°F | Resp 16 | Ht 64.0 in | Wt 158.7 lb

## 2020-08-03 DIAGNOSIS — R5383 Other fatigue: Secondary | ICD-10-CM

## 2020-08-03 DIAGNOSIS — R945 Abnormal results of liver function studies: Secondary | ICD-10-CM | POA: Diagnosis not present

## 2020-08-03 DIAGNOSIS — F411 Generalized anxiety disorder: Secondary | ICD-10-CM

## 2020-08-03 DIAGNOSIS — R42 Dizziness and giddiness: Secondary | ICD-10-CM | POA: Diagnosis not present

## 2020-08-03 DIAGNOSIS — K589 Irritable bowel syndrome without diarrhea: Secondary | ICD-10-CM

## 2020-08-03 DIAGNOSIS — R002 Palpitations: Secondary | ICD-10-CM

## 2020-08-03 DIAGNOSIS — R7989 Other specified abnormal findings of blood chemistry: Secondary | ICD-10-CM

## 2020-08-03 DIAGNOSIS — N3010 Interstitial cystitis (chronic) without hematuria: Secondary | ICD-10-CM

## 2020-08-04 LAB — HEPATIC FUNCTION PANEL
AG Ratio: 1.8 (calc) (ref 1.0–2.5)
ALT: 23 U/L (ref 6–29)
AST: 23 U/L (ref 10–30)
Albumin: 4.7 g/dL (ref 3.6–5.1)
Alkaline phosphatase (APISO): 113 U/L (ref 31–125)
Bilirubin, Direct: 0.1 mg/dL (ref 0.0–0.2)
Globulin: 2.6 g/dL (calc) (ref 1.9–3.7)
Indirect Bilirubin: 0.4 mg/dL (calc) (ref 0.2–1.2)
Total Bilirubin: 0.5 mg/dL (ref 0.2–1.2)
Total Protein: 7.3 g/dL (ref 6.1–8.1)

## 2020-08-04 LAB — T4, FREE: Free T4: 1.3 ng/dL (ref 0.8–1.8)

## 2020-08-04 LAB — TSH: TSH: 1.44 mIU/L

## 2020-08-17 ENCOUNTER — Other Ambulatory Visit: Payer: Self-pay

## 2020-08-17 ENCOUNTER — Ambulatory Visit (INDEPENDENT_AMBULATORY_CARE_PROVIDER_SITE_OTHER): Payer: BC Managed Care – PPO | Admitting: Certified Nurse Midwife

## 2020-08-17 ENCOUNTER — Encounter: Payer: Self-pay | Admitting: Certified Nurse Midwife

## 2020-08-17 VITALS — BP 108/63 | HR 64 | Ht 65.0 in | Wt 159.8 lb

## 2020-08-17 DIAGNOSIS — Z3041 Encounter for surveillance of contraceptive pills: Secondary | ICD-10-CM

## 2020-08-17 DIAGNOSIS — Z01419 Encounter for gynecological examination (general) (routine) without abnormal findings: Secondary | ICD-10-CM

## 2020-08-17 DIAGNOSIS — L700 Acne vulgaris: Secondary | ICD-10-CM | POA: Diagnosis not present

## 2020-08-17 MED ORDER — SLYND 4 MG PO TABS: 1.0000 | ORAL_TABLET | Freq: Every day | ORAL | 4 refills | Status: AC

## 2020-08-17 NOTE — Progress Notes (Signed)
ANNUAL PREVENTATIVE CARE GYN  ENCOUNTER NOTE  Subjective:       Olivia Sullivan is a 24 y.o. G0P0000 female here for a routine annual gynecologic exam.  Current complaints: 1.  Cystic acne worse with POP-unable to take OCP due to history of migraines  Denies difficulty breathing or respiratory distress, chest pain, abdominal pain, excessive vaginal bleeding, dysuria, and leg pain or swelling.    Gynecologic History  Patient's last menstrual period was 07/26/2020. Period Cycle (Days): 28 Period Duration (Days): 5-6 Period Pattern: Regular Menstrual Flow: Moderate Menstrual Control: Tampon Menstrual Control Change Freq (Hours): 6 Dysmenorrhea: (!) Mild Dysmenorrhea Symptoms: Cramping  Contraception: oral progesterone-only contraceptive   Upstream - 08/17/20 1511      Pregnancy Intention Screening   Does the patient want to become pregnant in the next year? No    Does the patient's partner want to become pregnant in the next year? No    Would the patient like to discuss contraceptive options today? Yes          The pregnancy intention screening data noted above was reviewed. Potential methods of contraception were discussed. The patient elected to proceed with Oral Contraceptive.   Last Pap: 08/2019. Results were: normal  Obstetric History  OB History  Gravida Para Term Preterm AB Living  0 0 0 0 0 0  SAB TAB Ectopic Multiple Live Births  0 0 0 0 0    Past Medical History:  Diagnosis Date  . Anxiety   . Depression   . Migraine     Past Surgical History:  Procedure Laterality Date  . COLONOSCOPY WITH PROPOFOL N/A 03/11/2019   Procedure: COLONOSCOPY WITH PROPOFOL;  Surgeon: [name], MD;  Location: [facility];  Service: Endoscopy;  Laterality: N/A;  . CYSTO WITH HYDRODISTENSION N/A 10/25/2019   Procedure: CYSTOSCOPY/HYDRODISTENSION;  Surgeon: Alfredo Martinez, MD;  Location: [facility];  Service: Urology;  Laterality: N/A;  . ESOPHAGOGASTRODUODENOSCOPY  (EGD) WITH PROPOFOL N/A 03/11/2019   Procedure: ESOPHAGOGASTRODUODENOSCOPY (EGD) WITH PROPOFOL;  Surgeon: [name], MD;  Location: [facility];  Service: Endoscopy;  Laterality: N/A;    Current Outpatient Medications on File Prior to Visit  Medication Sig Dispense Refill  . augmented betamethasone dipropionate (DIPROLENE-AF) 0.05 % cream Apply topically 2 (two) times daily.    . minocycline (MINOCIN) 100 MG capsule Take 100 mg by mouth daily.    . NORETHINDRONE PO Take by mouth.    . tretinoin (RETIN-A) 0.025 % cream SMARTSIG:Sparingly Topical Every Other Day     No current facility-administered medications on file prior to visit.    Allergies  Allergen Reactions  . Latex Rash    Social History   Socioeconomic History  . Marital status: Significant Other    Spouse name: Not on file  . Number of children: 0  . Years of education: Not on file  . Highest education level: Not on file  Occupational History  . Occupation: Psychologist, forensic  Tobacco Use  . Smoking status: Never Smoker  . Smokeless tobacco: Never Used  Vaping Use  . Vaping Use: Never used  Substance and Sexual Activity  . Alcohol use: Yes    Comment: occasionally  . Drug use: Yes    Types: Marijuana  . Sexual activity: Yes    Partners: Male    Birth control/protection: Pill  Other Topics Concern  . Not on file  Social History Narrative   Lives with Boyfriend in [place]; family and friends live in [place].  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 Difficulty of Paying Living Expenses: Not on file  Food Insecurity:   . Worried About Programme researcher, broadcasting/film/video in the Last Year: Not on file  . Ran Out of Food in the Last Year: Not on file  Transportation Needs:   . Lack of Transportation (Medical): Not on file  . Lack of Transportation (Non-Medical): Not on file  Physical Activity:   . Days of Exercise per Week: Not on file  . Minutes of  Exercise per Session: Not on file  Stress:   . Feeling of Stress : Not on file  Social Connections:   . Frequency of Communication with Friends and Family: Not on file  . Frequency of Social Gatherings with Friends and Family: Not on file  . Attends Religious Services: Not on file  . Active Member of Clubs or Organizations: Not on file  . Attends Banker Meetings: Not on file  . Marital Status: Not on file  Intimate Partner Violence:   . Fear of Current or Ex-Partner: Not on file  . Emotionally Abused: Not on file  . Physically Abused: Not on file  . Sexually Abused: Not on file    Family History  Problem Relation Age of Onset  . Diabetes Father   . Depression Father   . Anxiety disorder Father   . Depression Sister   . Anxiety disorder Sister   . Irritable bowel syndrome Sister   . Depression Brother   . Anxiety disorder Brother   . Irritable bowel syndrome Brother   . Diabetes Maternal Grandmother   . Endometriosis Maternal Grandmother   . Endometriosis Maternal Aunt   . Breast cancer Neg Hx   . Ovarian cancer Neg Hx   . Colon cancer Neg Hx     The following portions of the patient's history were reviewed and updated as appropriate: allergies, current medications, past family history, past medical history, past social history, past surgical history and problem list.  Review of Systems  ROS negative except as noted above. Information obtained from patient.    Objective:   BP 108/63   Pulse 64   Ht 5\' 5"  (1.651 m)   Wt 159 lb 12.8 oz (72.5 kg)   LMP 07/26/2020   BMI 26.59 kg/m    CONSTITUTIONAL: Well-developed, well-nourished female in no acute distress.   PSYCHIATRIC: Normal mood and affect. Normal behavior. Normal judgment and thought content.  NEUROLGIC: Alert and oriented to person, place, and time. Normal muscle tone coordination. No cranial nerve deficit noted.  HENT:  Normocephalic, atraumatic, External right and left ear normal.   EYES:  Conjunctivae and EOM are normal. Pupils are equal and round.   NECK: Normal range of motion, supple, no masses.  Normal thyroid.   SKIN: Skin is warm and dry. No rash noted except for facial cystic acne. Not diaphoretic. No erythema. No pallor.   CARDIOVASCULAR: Normal heart rate noted, regular rhythm, no murmur.  RESPIRATORY: Clear to auscultation bilaterally. Effort and breath sounds normal, no problems with respiration noted.  BREASTS: Symmetric in size. No masses, skin changes, nipple drainage, or lymphadenopathy.  ABDOMEN: Soft, normal bowel sounds, no distention noted.  No tenderness, rebound or guarding.   PELVIC:  External Genitalia: Normal  Vagina: Normal  Cervix: Normal  Uterus: Normal  Adnexa: Normal  MUSCULOSKELETAL: Normal range of motion. No tenderness.  No cyanosis, clubbing, or  edema.  2+ distal pulses.  LYMPHATIC: No Axillary, Supraclavicular, or Inguinal Adenopathy.  Assessment:   Annual gynecologic examination 24 y.o.   Contraception: oral progesterone-only contraceptive   Overweight   Problem List Items Addressed This Visit    None    Visit Diagnoses    Well woman exam    -  Primary   Surveillance for birth control, oral contraceptives       Cystic acne          Plan:   Pap: Not needed  Labs: Managed by PCP   Routine preventative health maintenance measures emphasized: Exercise/Diet/Weight control, Tobacco Warnings, Alcohol/Substance use risks and Stress Management; see AVS  Rx Slynd, see orders; samples provided  Reviewed red flag symptoms and when to call  Return to Clinic - 1 Year for [facility] or sooner if needed   Serafina Royals, CNM  [facility], [facility] 08/17/20 3:37 PM

## 2020-08-17 NOTE — Patient Instructions (Signed)
Preventive Care 21-23 Years Old, Female Preventive care refers to visits with your health care provider and lifestyle choices that can promote health and wellness. This includes:  A yearly physical exam. This may also be called an annual well check.  Regular dental visits and eye exams.  Immunizations.  Screening for certain conditions.  Healthy lifestyle choices, such as eating a healthy diet, getting regular exercise, not using drugs or products that contain nicotine and tobacco, and limiting alcohol use. What can I expect for my preventive care visit? Physical exam Your health care provider will check your:  Height and weight. This may be used to calculate body mass index (BMI), which tells if you are at a healthy weight.  Heart rate and blood pressure.  Skin for abnormal spots. Counseling Your health care provider may ask you questions about your:  Alcohol, tobacco, and drug use.  Emotional well-being.  Home and relationship well-being.  Sexual activity.  Eating habits.  Work and work environment.  Method of birth control.  Menstrual cycle.  Pregnancy history. What immunizations do I need?  Influenza (flu) vaccine  This is recommended every year. Tetanus, diphtheria, and pertussis (Tdap) vaccine  You may need a Td booster every 10 years. Varicella (chickenpox) vaccine  You may need this if you have not been vaccinated. Human papillomavirus (HPV) vaccine  If recommended by your health care provider, you may need three doses over 6 months. Measles, mumps, and rubella (MMR) vaccine  You may need at least one dose of MMR. You may also need a second dose. Meningococcal conjugate (MenACWY) vaccine  One dose is recommended if you are age 19-21 years and a first-year college student living in a residence hall, or if you have one of several medical conditions. You may also need additional booster doses. Pneumococcal conjugate (PCV13) vaccine  You may need  this if you have certain conditions and were not previously vaccinated. Pneumococcal polysaccharide (PPSV23) vaccine  You may need one or two doses if you smoke cigarettes or if you have certain conditions. Hepatitis A vaccine  You may need this if you have certain conditions or if you travel or work in places where you may be exposed to hepatitis A. Hepatitis B vaccine  You may need this if you have certain conditions or if you travel or work in places where you may be exposed to hepatitis B. Haemophilus influenzae type b (Hib) vaccine  You may need this if you have certain conditions. You may receive vaccines as individual doses or as more than one vaccine together in one shot (combination vaccines). Talk with your health care provider about the risks and benefits of combination vaccines. What tests do I need?  Blood tests  Lipid and cholesterol levels. These may be checked every 5 years starting at age 20.  Hepatitis C test.  Hepatitis B test. Screening  Diabetes screening. This is done by checking your blood sugar (glucose) after you have not eaten for a while (fasting).  Sexually transmitted disease (STD) testing.  BRCA-related cancer screening. This may be done if you have a family history of breast, ovarian, tubal, or peritoneal cancers.  Pelvic exam and Pap test. This may be done every 3 years starting at age 21. Starting at age 30, this may be done every 5 years if you have a Pap test in combination with an HPV test. Talk with your health care provider about your test results, treatment options, and if necessary, the need for more tests.   Follow these instructions at home: Eating and drinking   Eat a diet that includes fresh fruits and vegetables, whole grains, lean protein, and low-fat dairy.  Take vitamin and mineral supplements as recommended by your health care provider.  Do not drink alcohol if: ? Your health care provider tells you not to drink. ? You are  pregnant, may be pregnant, or are planning to become pregnant.  If you drink alcohol: ? Limit how much you have to 0-1 drink a day. ? Be aware of how much alcohol is in your drink. In the [place] one drink equals one 12 oz bottle of beer (355 mL), one 5 oz glass of wine (148 mL), or one 1 oz glass of hard liquor (44 mL). Lifestyle  Take daily care of your teeth and gums.  Stay active. Exercise for at least 30 minutes on 5 or more days each week.  Do not use any products that contain nicotine or tobacco, such as cigarettes, e-cigarettes, and chewing tobacco. If you need help quitting, ask your health care provider.  If you are sexually active, practice safe sex. Use a condom or other form of birth control (contraception) in order to prevent pregnancy and STIs (sexually transmitted infections). If you plan to become pregnant, see your health care provider for a preconception visit. What's next?  Visit your health care provider once a year for a well check visit.  Ask your health care provider how often you should have your eyes and teeth checked.  Stay up to date on all vaccines. This information is not intended to replace advice given to you by your health care provider. Make sure you discuss any questions you have with your health care provider. Document Revised: 06/04/2018 Document Reviewed: 06/04/2018 Elsevier Patient Education  [place].    Drospirenone tablets (contraception) What is this medicine? DROSPIRENONE (dro SPY re nown) is an oral contraceptive (birth control pill). The product contains a female hormone known as a progestin. It is used to prevent pregnancy. This medicine may be used for other purposes; ask your health care provider or pharmacist if you have questions. COMMON BRAND NAME(S): Slynd What should I tell my health care provider before I take this medicine? They need to know if you have any of these conditions:  abnormal vaginal bleeding  adrenal  gland disease  blood vessel disease or blood clots  breast, cervical, endometrial, ovarian, liver, or uterine cancer  diabetes  heart disease or recent heart attack  high potassium level  kidney disease  liver disease  mental depression  migraine headaches  stroke  an unusual or allergic reaction to drospirenone, progestins, or other medicines, foods, dyes, or preservatives  pregnant or trying to get pregnant  breast-feeding How should I use this medicine? Take this medicine by mouth. To reduce nausea, this medicine may be taken with food. Follow the directions on the prescription label. Take this medicine at the same time each day and in the order directed on the package. Do not take your medicine more often than directed. A patient package insert for the product will be given with each prescription and refill. Read this sheet carefully each time. The sheet may change frequently. Talk to your pediatrician regarding the use of this medicine in children. Special care may be needed. This medicine has been used in female children who have started having menstrual periods. Overdosage: If you think you have taken too much of this medicine contact a poison control center or emergency  room at once. NOTE: This medicine is only for you. Do not share this medicine with others. What if I miss a dose? If you miss a dose, take it as soon as you can and refer to the patient information sheet you received with your medicine for direction. If you miss more than one pill, this medicine may not be as effective and you may need to use another form of birth control. What may interact with this medicine? Do not take this medicine with any of the following medications:  atazanavir; cobicistat  bosentan  fosamprenavir This medicine may also interact with the following medications:  aprepitant  barbiturates like phenobarbital, primidone  carbamazepine  certain antibiotics like  clarithromycin, rifampin, rifabutin, rifapentine  certain antivirals for HIV or hepatitis  certain diuretics like amiloride, spironolactone, triamterene  certain medicines for fungal infections like griseofulvin, ketoconazole, itraconazole, voriconazole  certain medicines for blood pressure, heart disease  cyclosporine  felbamate  heparin  medicines for diabetes  modafinil  NSAIDs, medicines for pain and inflammation, like ibuprofen or naproxen  oxcarbazepine  phenytoin  potassium supplements  rufinamide  St. John's wort  topiramate This list may not describe all possible interactions. Give your health care provider a list of all the medicines, herbs, non-prescription drugs, or dietary supplements you use. Also tell them if you smoke, drink alcohol, or use illegal drugs. Some items may interact with your medicine. What should I watch for while using this medicine? Visit your doctor or health care professional for regular checks on your progress. You will need a regular breast and pelvic exam and Pap smear while on this medicine. You may need blood work done while you are taking this medicine. If you have any reason to think you are pregnant, stop taking this medicine right away and contact your doctor or health care professional. This medicine does not protect you against HIV infection (AIDS) or any other sexually transmitted diseases. If you are going to have elective surgery, you may need to stop taking this medicine before the surgery. Consult your health care professional for advice. What side effects may I notice from receiving this medicine? Side effects that you should report to your doctor or health care professional as soon as possible:  allergic reactions like skin rash, itching or hives, swelling of the face, lips, or tongue  breast tissue changes or discharge  depressed mood  severe pain, swelling, or tenderness in the abdomen  signs and symptoms of a  blood clot such as chest pain; shortness of breath; pain, swelling, or warmth in the leg  signs and symptoms of increased potassium like muscle weakness; chest pain; or fast, irregular heartbeat  signs and symptoms of liver injury like dark yellow or brown urine; general ill feeling or flu-like symptoms; light-colored stools; loss of appetite; nausea; right upper belly pain; unusually weak or tired; yellowing of the eyes or skin  signs and symptoms of a stroke like changes in vision; confusion; trouble speaking or understanding; severe headaches; sudden numbness or weakness of the face, arm or leg; trouble walking; dizziness; loss of balance or coordination  unusual vaginal bleeding  unusually weak or tired Side effects that usually do not require medical attention (report these to your doctor or health care professional if they continue or are bothersome):  acne  breast tenderness  headache  menstrual cramps  nausea  weight gain This list may not describe all possible side effects. Call your doctor for medical advice about side effects. You  may report side effects to FDA at 1-800-FDA-1088. Where should I keep my medicine? Keep out of the reach of children. Store at room temperature between 20 and 25 degrees C (68 and 77 degrees F). Throw away any unused medicine after the expiration date. NOTE: This sheet is a summary. It may not cover all possible information. If you have questions about this medicine, talk to your doctor, pharmacist, or health care provider.  2020 Elsevier/Gold Standard (2018-03-04 15:01:56)

## 2020-08-23 ENCOUNTER — Telehealth: Payer: Self-pay

## 2020-08-23 NOTE — Telephone Encounter (Signed)
PA denied- reason for denial received.

## 2020-08-23 NOTE — Telephone Encounter (Signed)
[employer] called in for a [place] on this pts BC. They are calling saying that they are denying it reason  for denying pt must try all BC another [facility] such as Depo.  They faxed over the paperwork

## 2020-08-28 DIAGNOSIS — Z20822 Contact with and (suspected) exposure to covid-19: Secondary | ICD-10-CM | POA: Diagnosis not present

## 2020-09-15 ENCOUNTER — Ambulatory Visit: Payer: Self-pay

## 2020-09-15 NOTE — Telephone Encounter (Signed)
Phone call from pt.  Reported she just ret'd from vacation, and has a white bump on left eyelid, with burning, itching, and redness of periorbital area.  Onset of white bump was 2 days ago; onset of redness and itching was earlier, this AM.  Reported the discomfort is mild.  Denied any redness of the sclera.  Denied any drainage from left eye.  Stated that, while on vacation, she was snorkeling, and questions if she "picked up something that has caused an infection."  Denied fever.  Pt. Denied any other symptoms at this time.  Is at work, and requested to call her back with appt. Information.  Care advice given per protocol.   Phone call to [place] with [facility].  No appt. Availability today.    Phone call to pt. Rec'd her voice mail; Advised it is recommended that she go to UC, since no appts. available at PCP office today.   Left vm to return call to Triage nurse, to confirm she rec'd this message.    Will forward Triage note to PCP.    Reason for Disposition . Eyelid is red and painful (or tender to touch)    C/o burning and itching of left eye- periorbital area.  Answer Assessment - Initial Assessment Questions 1. LOCATION: Location: "What's red, the eyeball or the outer eyelids?" (Note: when callers say the eye is red, they usually mean the sclera is red)       Redness surrounding left eye; no redness of sclera 2. REDNESS OF SCLERA: "Is the redness in one or both eyes?" "When did the redness start?"      Denied redness of sclera 3. ONSET: "When did the eye become red?" (e.g., hours, days)      today 4. EYELIDS: "Are the eyelids red or swollen?" If Yes, ask: "How much?"      White raised are on left eyelid 5. VISION: "Is there any difficulty seeing clearly?"      Denied  6. ITCHING: "Does it feel itchy?" If so ask: "How bad is it" (e.g., Scale 1-10; or mild, moderate, severe)     mild 7. PAIN: "Is there any pain? If Yes, ask: "How bad is it?" (e.g., Scale 1-10; or  mild, moderate, severe)     Burning and itching mild 8. CONTACT LENS: "Do you wear contacts?"     No  9. CAUSE: "What do you think is causing the redness?"     Just ret'd from vacation- was snorkeling on trip 10. OTHER SYMPTOMS: "Do you have any other symptoms?" (e.g., fever, runny nose, cough, vomiting)       Denied other symptoms  Protocols used: EYE - RED WITHOUT PUS-A-AH

## 2020-10-02 ENCOUNTER — Ambulatory Visit (INDEPENDENT_AMBULATORY_CARE_PROVIDER_SITE_OTHER): Payer: BC Managed Care – PPO | Admitting: Certified Nurse Midwife

## 2020-10-02 ENCOUNTER — Encounter: Payer: Self-pay | Admitting: Certified Nurse Midwife

## 2020-10-02 ENCOUNTER — Other Ambulatory Visit (HOSPITAL_COMMUNITY)
Admission: RE | Admit: 2020-10-02 | Discharge: 2020-10-02 | Disposition: A | Discharge status: Home / Self Care | Payer: BC Managed Care – PPO | Source: Ambulatory Visit | Attending: Certified Nurse Midwife | Admitting: Certified Nurse Midwife

## 2020-10-02 ENCOUNTER — Other Ambulatory Visit: Payer: Self-pay

## 2020-10-02 VITALS — BP 101/69 | HR 71 | Ht 65.0 in | Wt 164.5 lb

## 2020-10-02 DIAGNOSIS — N898 Other specified noninflammatory disorders of vagina: Secondary | ICD-10-CM | POA: Insufficient documentation

## 2020-10-02 DIAGNOSIS — N9089 Other specified noninflammatory disorders of vulva and perineum: Secondary | ICD-10-CM

## 2020-10-02 NOTE — Progress Notes (Signed)
GYN ENCOUNTER NOTE  Subjective:       Olivia Sullivan is a 24 y.o. G0P0000 female is here for gynecologic evaluation of the following issues:  1. Burning and itching without discharge for the last two (2) weeks with little improvement; hasn't attempted any home measures 2. Concerned regarding the denial of Slynd by her insurance  Denies shortness of breath, respiratory difficulty, chest pain, abdominal pain, excessive vaginal bleeding, dysuria, and leg pain or swelling.    Gynecologic History  No LMP recorded. (Menstrual status: Oral contraceptives).  Period Pattern: (!) Irregular Menstrual Flow: Light Dysmenorrhea: None  Contraception: oral progesterone-only contraceptive, Slynd  Last Pap: 08/12/2019 Results were: Neg.  Obstetric History  OB History  Gravida Para Term Preterm AB Living  0 0 0 0 0 0  SAB IAB Ectopic Multiple Live Births  0 0 0 0 0    Past Medical History:  Diagnosis Date  . Anxiety   . Depression   . Migraine     Past Surgical History:  Procedure Laterality Date  . COLONOSCOPY WITH PROPOFOL N/A 03/11/2019   Procedure: COLONOSCOPY WITH PROPOFOL;  Surgeon: [name], MD;  Location: [facility] ENDOSCOPY;  Service: Endoscopy;  Laterality: N/A;  . CYSTO WITH HYDRODISTENSION N/A 10/25/2019   Procedure: CYSTOSCOPY/HYDRODISTENSION;  Surgeon: Alfredo Martinez, MD;  Location: [facility] ORS;  Service: Urology;  Laterality: N/A;  . ESOPHAGOGASTRODUODENOSCOPY (EGD) WITH PROPOFOL N/A 03/11/2019   Procedure: ESOPHAGOGASTRODUODENOSCOPY (EGD) WITH PROPOFOL;  Surgeon: [name], MD;  Location: [facility] ENDOSCOPY;  Service: Endoscopy;  Laterality: N/A;  . NO PAST SURGERIES      Current Outpatient Medications on File Prior to Visit  Medication Sig Dispense Refill  . augmented betamethasone dipropionate (DIPROLENE-AF) 0.05 % cream Apply topically 2 (two) times daily.    . Drospirenone (SLYND) 4 MG TABS Take 1 tablet by mouth daily. 84 tablet 4  . tretinoin (RETIN-A)  0.025 % cream SMARTSIG:Sparingly Topical Every Other Day     No current facility-administered medications on file prior to visit.    Allergies  Allergen Reactions  . Latex Rash    Social History   Socioeconomic History  . Marital status: Significant Other    Spouse name: Not on file  . Number of children: 0  . Years of education: Not on file  . Highest education level: Not on file  Occupational History  . Occupation: Psychologist, forensic  Tobacco Use  . Smoking status: Never Smoker  . Smokeless tobacco: Never Used  Vaping Use  . Vaping Use: Never used  Substance and Sexual Activity  . Alcohol use: Yes    Comment: occasionally  . Drug use: Yes    Types: Marijuana    Comment: occas  . Sexual activity: Yes    Partners: Male    Birth control/protection: Pill  Other Topics Concern  . Not on file  Social History Narrative   Lives with Boyfriend in [place]; family and friends live in [place].  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 Diabetes Father   . Depression Father   . Anxiety disorder Father   . Depression Sister   . Anxiety  disorder Sister   . Irritable bowel syndrome Sister   . Depression Brother   . Anxiety disorder Brother   . Irritable bowel syndrome Brother   . Diabetes Maternal Grandmother   . Endometriosis Maternal Grandmother   . Endometriosis Maternal Aunt   . Breast cancer Neg Hx   . Ovarian cancer Neg Hx   . Colon cancer Neg Hx     The following portions of the patient's history were reviewed and updated as appropriate: allergies, current medications, past family history, past medical history, past social history, past surgical  history and problem list.  Review of Systems  ROS - Negative other then what was reported above. Information obtained from patient.   Objective:   BP 101/69   Pulse 71   Ht 5\' 5"  (1.651 m)   Wt 74.6 kg   BMI 27.37 kg/m    CONSTITUTIONAL: Well-developed, well-nourished female in no acute distress.   PELVIC:  External Genitalia: Normal  Vagina: Normal, swab collected    MUSCULOSKELETAL: Normal range of motion. No tenderness.  No cyanosis, clubbing, or edema.   Assessment:   1. Vaginal itching  2. Vulvar irritation  Plan:   Labs: Swab obtained test for BV and yeast. Patient declined STI testing.  Reviewed red flag symptoms and when to call.   Samples of Slynd and Hylafem given, see AVS for education.   RTC if symptoms do not improve or worsen.   , Student[employer] 10/02/20 3:41 PM

## 2020-10-02 NOTE — Patient Instructions (Signed)
Vaginitis  Vaginitis is irritation and swelling (inflammation) of the vagina. It happens when normal bacteria and yeast in the vagina grow too much. There are many types of this condition. Treatment will depend on the type you have. Follow these instructions at home: Lifestyle  Keep your vagina area clean and dry. ? Avoid using soap. ? Rinse the area with water.  Do not do the following until your doctor says it is okay: ? Wash and clean out the vagina (douche). ? Use tampons. ? Have sex.  Wipe from front to back after going to the bathroom.  Let air reach your vagina. ? Wear cotton underwear. ? Do not wear:  Underwear while you sleep.  Tight pants.  Thong underwear.  Underwear or nylons without a cotton panel. ? Take off any wet clothing, such as bathing suits, as soon as possible.  Use gentle, non-scented products. Do not use things that can irritate the vagina, such as fabric softeners. Avoid the following products if they are scented: ? Feminine sprays. ? Detergents. ? Tampons. ? Feminine hygiene products. ? Soaps or bubble baths.  Practice safe sex and use condoms. General instructions  Take over-the-counter and prescription medicines only as told by your doctor.  If you were prescribed an antibiotic medicine, take or use it as told by your doctor. Do not stop taking or using the antibiotic even if you start to feel better.  Keep all follow-up visits as told by your doctor. This is important. Contact a doctor if:  You have pain in your belly.  You have a fever.  Your symptoms last for more than 2-3 days. Get help right away if:  You have a fever and your symptoms get worse all of a sudden. Summary  Vaginitis is irritation and swelling of the vagina. It can happen when the normal bacteria and yeast in the vagina grow too much. There are many types.  Treatment will depend on the type you have.  Do not douche, use tampons , or have sex until your health  care provider approves. When you can return to sex, practice safe sex and use condoms. This information is not intended to replace advice given to you by your health care provider. Make sure you discuss any questions you have with your health care provider. Document Revised: 09/05/2017 Document Reviewed: 10/15/2016 Elsevier Patient Education  2020 Elsevier Inc.   Vaginal Yeast Infection, Adult  Vaginal yeast infection is a condition that causes vaginal discharge as well as soreness, swelling, and redness (inflammation) of the vagina. This is a common condition. Some women get this infection frequently. What are the causes? This condition is caused by a change in the normal balance of the yeast (candida) and bacteria that live in the vagina. This change causes an overgrowth of yeast, which causes the inflammation. What increases the risk? The condition is more likely to develop in women who:  Take antibiotic medicines.  Have diabetes.  Take birth control pills.  Are pregnant.  Douche often.  Have a weak body defense system (immune system).  Have been taking steroid medicines for a long time.  Frequently wear tight clothing. What are the signs or symptoms? Symptoms of this condition include:  White, thick, creamy vaginal discharge.  Swelling, itching, redness, and irritation of the vagina. The lips of the vagina (vulva) may be affected as well.  Pain or a burning feeling while urinating.  Pain during sex. How is this diagnosed? This condition is diagnosed based on:  Your medical history.  A physical exam.  A pelvic exam. Your health care provider will examine a sample of your vaginal discharge under a microscope. Your health care provider may send this sample for testing to confirm the diagnosis. How is this treated? This condition is treated with medicine. Medicines may be over-the-counter or prescription. You may be told to use one or more of the  following:  Medicine that is taken by mouth (orally).  Medicine that is applied as a cream (topically).  Medicine that is inserted directly into the vagina (suppository). Follow these instructions at home:  Lifestyle  Do not have sex until your health care provider approves. Tell your sex partner that you have a yeast infection. That person should go to his or her health care provider and ask if they should also be treated.  Do not wear tight clothes, such as pantyhose or tight pants.  Wear breathable cotton underwear. General instructions  Take or apply over-the-counter and prescription medicines only as told by your health care provider.  Eat more yogurt. This may help to keep your yeast infection from returning.  Do not use tampons until your health care provider approves.  Try taking a sitz bath to help with discomfort. This is a warm water bath that is taken while you are sitting down. The water should only come up to your hips and should cover your buttocks. Do this 3-4 times per day or as told by your health care provider.  Do not douche.  If you have diabetes, keep your blood sugar levels under control.  Keep all follow-up visits as told by your health care provider. This is important. Contact a health care provider if:  You have a fever.  Your symptoms go away and then return.  Your symptoms do not get better with treatment.  Your symptoms get worse.  You have new symptoms.  You develop blisters in or around your vagina.  You have blood coming from your vagina and it is not your menstrual period.  You develop pain in your abdomen. Summary  Vaginal yeast infection is a condition that causes discharge as well as soreness, swelling, and redness (inflammation) of the vagina.  This condition is treated with medicine. Medicines may be over-the-counter or prescription.  Take or apply over-the-counter and prescription medicines only as told by your health care  provider.  Do not douche. Do not have sex or use tampons until your health care provider approves.  Contact a health care provider if your symptoms do not get better with treatment or your symptoms go away and then return. This information is not intended to replace advice given to you by your health care provider. Make sure you discuss any questions you have with your health care provider. Document Revised: 04/23/2019 Document Reviewed: 02/09/2018 Elsevier Patient Education  2020 [employer].

## 2020-10-02 NOTE — Progress Notes (Signed)
I have seen, interviewed, and examined the patient in conjunction with the [facility] and affirm the diagnosis and management plan.   Gunnar Bulla, CNM [facility], [facility] 10/02/20 5:31 PM

## 2020-10-02 NOTE — Progress Notes (Signed)
Pt c/o burning and itching in the vaginal area x 2 weeks.

## 2020-10-04 LAB — CERVICOVAGINAL ANCILLARY ONLY
Bacterial Vaginitis (gardnerella): NEGATIVE
Candida Glabrata: NEGATIVE
Candida Vaginitis: NEGATIVE
Comment: NEGATIVE
Comment: NEGATIVE
Comment: NEGATIVE

## 2020-10-21 ENCOUNTER — Other Ambulatory Visit: Payer: BLUE CROSS/BLUE SHIELD

## 2020-10-21 ENCOUNTER — Other Ambulatory Visit: Payer: Self-pay

## 2020-10-21 DIAGNOSIS — Z20822 Contact with and (suspected) exposure to covid-19: Secondary | ICD-10-CM

## 2020-10-23 ENCOUNTER — Ambulatory Visit (INDEPENDENT_AMBULATORY_CARE_PROVIDER_SITE_OTHER): Payer: BLUE CROSS/BLUE SHIELD | Admitting: Internal Medicine

## 2020-10-23 ENCOUNTER — Encounter: Payer: Self-pay | Admitting: Internal Medicine

## 2020-10-23 VITALS — Temp 100.0°F

## 2020-10-23 DIAGNOSIS — R112 Nausea with vomiting, unspecified: Secondary | ICD-10-CM

## 2020-10-23 DIAGNOSIS — B349 Viral infection, unspecified: Secondary | ICD-10-CM | POA: Diagnosis not present

## 2020-10-23 NOTE — Progress Notes (Signed)
Name: Olivia Sullivan   MRN: [id number]    DOB: July 14, 1996   Date:10/23/2020       Progress Note  Subjective  Chief Complaint  Chief Complaint  Patient presents with  . COVID Symptoms    Body aches, sore throat, nausea, and cough. Onset 10/11/2020 currently waiting on Covid test results.    I connected with  Bethena Roys on 10/23/20 at  1:20 PM EST by telephone and verified that I am speaking with the correct person using two identifiers.  I discussed the limitations, risks, security and privacy concerns of performing an evaluation and management service by telephone and the availability of in person appointments. The patient expressed understanding and agreed to proceed. Staff also discussed with the patient that there may be a patient responsible charge related to this service. Patient Location: Home Provider Location: [facility] Additional Individuals present: none  HPI  Patient is a 25 y.o female Presents today with the above complaints  Covid imm status - had vaccine X 2, had booster Sx's started Wed  Had test for Covid Sat, [facility] and awaiting result No exposures aware of, was just at a party with family members, not any ill that she knows of  No cough, no production No marked SOB + slight fever, T - 100 right now,  feeling feverish with chills and sweats + sore throat.  +mild congestion, no marked PND No loss of smell, loss of taste + N/V, vomited this am, first time this am, no blood,only once + muscle aches + marked loose stools/diarrhea, went 2-3 times today, no blood No CP,  passing out episodes,+ lightheaded  Has tried dayquil/nyquil and tylenol  Tob - never smoker Comorbid conditions reviewed No asthma/COPD hx,  No h/o DM, heart disease, CKD,   Work - at [employer], [place], not take Covid very seriously per patient.   Patient Active Problem List   Diagnosis Date Noted  . Abnormal liver function tests 08/03/2020  . History of iron deficiency 07/06/2020  .  Irritable bowel syndrome 07/06/2020  . Intermittent lightheadedness 07/05/2020  . Interstitial cystitis 12/20/2019  . Generalized abdominal pain   . Rectum inflammation   . Internal hemorrhoids   . Diverticulosis of small intestine without hemorrhage   . Nausea and vomiting   . Lower abdominal pain 01/29/2019  . Diarrhea 01/29/2019  . GAD (generalized anxiety disorder) 01/29/2019  . Severe episode of recurrent major depressive disorder, without psychotic features (HCC) 01/29/2019  . Panic disorder with agoraphobia 06/19/2018    Past Surgical History:  Procedure Laterality Date  . COLONOSCOPY WITH PROPOFOL N/A 03/11/2019   Procedure: COLONOSCOPY WITH PROPOFOL;  Surgeon: [name], MD;  Location: [facility] ENDOSCOPY;  Service: Endoscopy;  Laterality: N/A;  . CYSTO WITH HYDRODISTENSION N/A 10/25/2019   Procedure: CYSTOSCOPY/HYDRODISTENSION;  Surgeon: Alfredo Martinez, MD;  Location: [facility] ORS;  Service: Urology;  Laterality: N/A;  . ESOPHAGOGASTRODUODENOSCOPY (EGD) WITH PROPOFOL N/A 03/11/2019   Procedure: ESOPHAGOGASTRODUODENOSCOPY (EGD) WITH PROPOFOL;  Surgeon: [name], MD;  Location: [facility] ENDOSCOPY;  Service: Endoscopy;  Laterality: N/A;  . NO PAST SURGERIES      Family History  Problem Relation Age of Onset  . Diabetes Father   . Depression Father   . Anxiety disorder Father   . Depression Sister   . Anxiety disorder Sister   . Irritable bowel syndrome Sister   . Depression Brother   . Anxiety disorder Brother   . Irritable bowel syndrome Brother   . Diabetes Maternal  Grandmother   . Endometriosis Maternal Grandmother   . Endometriosis Maternal Aunt   . Breast cancer Neg Hx   . Ovarian cancer Neg Hx   . Colon cancer Neg Hx     Social History   Tobacco Use  . Smoking status: Never Smoker  . Smokeless tobacco: Never Used  Substance Use Topics  . Alcohol use: Yes    Comment: occasionally     Current Outpatient Medications:  .  Drospirenone (SLYND)  4 MG TABS, Take 1 tablet by mouth daily., Disp: 84 tablet, Rfl: 4 .  augmented betamethasone dipropionate (DIPROLENE-AF) 0.05 % cream, Apply topically 2 (two) times daily. (Patient not taking: Reported on 10/23/2020), Disp: , Rfl:  .  tretinoin (RETIN-A) 0.025 % cream, SMARTSIG:Sparingly Topical Every Other Day (Patient not taking: Reported on 10/23/2020), Disp: , Rfl:   Allergies  Allergen Reactions  . Latex Rash    With staff assistance, above reviewed with the patient today.  ROS: As per HPI, otherwise no specific complaints on a limited and focused system review   Objective  Virtual encounter, vitals not obtained.  There is no height or weight on file to calculate BMI.  Physical Exam   Appears in NAD via conversation, pleasant, obvious nasal voice Pulmonary/Chest: No obvious respiratory distress. Speaking in complete sentences Neurological: Pt is alert,Speech is normal Psychiatric: Patient has a normal mood and affect, behavior is normal. Judgment and thought content normal.   No results found for this or any previous visit (from the past 72 hour(s)).  PHQ2/9: Depression screen [facility] 2/9 10/23/2020 08/03/2020 07/06/2020 07/05/2020 09/23/2019  Decreased Interest 0 0 0 0 1  Down, Depressed, Hopeless 0 0 0 0 1  PHQ - 2 Score 0 0 0 0 2  Altered sleeping - - - - 3  Tired, decreased energy - - - - 3  Change in appetite - - - - 1  Feeling bad or failure about yourself  - - - - 1  Trouble concentrating - - - - 3  Moving slowly or fidgety/restless - - - - 1  Suicidal thoughts - - - - 0  PHQ-9 Score - - - - 14  Difficult doing work/chores - - - - Somewhat difficult   PHQ-2/9 Result reviewed  Fall Risk: Fall Risk  10/23/2020 08/03/2020 07/05/2020 09/23/2019 05/17/2019  Falls in the past year? 0 0 0 0 0  Number falls in past yr: 0 0 0 0 0  Injury with Fall? 0 0 0 0 0  Follow up - - Falls evaluation completed Falls evaluation completed Falls evaluation completed     Assessment &  Plan  1. Viral syndrome 2. N/V Patient noted her symptoms started Wednesday.  Have not improved at all with over-the-counter entities to date.  Noted her constellation of symptoms are more consistent with a viral type syndrome, with real concerns for COVID presently She did have a test done on Saturday, still awaiting that result. Noted the treatment options available for COVID presently, although in very limited supply and available almost predominantly to higher risk individuals based on age and comorbidities. Felt best to proceed as follows: Continue with symptomatic measures, and emphasized the importance of staying hydrated with some vomiting starting this morning.  Has only vomited 1 time.  Also has had some looser bowel movements. Recommended a Pepto-Bismol type entity to help as needed in the short-term Also continue Tylenol products as needed, and would use a Mucinex or Robitussin type product as an  expectorant as needed in the short-term. Do feel she needs to remain out of work presently, and she stated she needed a note to do so. Noted if her COVID test returns positive, her management likely will not change, nor will her returning to work timeframe, and she should not return to work until symptoms have significantly improved/almost resolved, and if the COVID test is positive, she needs to wear a mask for at least 7 days after returning to work.  Should remain isolated until symptoms are much improved/almost resolved. Will ask staff to help get her a work note through [employer] today, stating she should not return to work until symptoms significantly improved, with no time or date able to be mentioned presently as to exact return. Also emphasized close monitoring, and if symptoms not improving or more problematic as the week is progressing, she needs to follow-up.  Also noted if symptoms significantly worsen, with increased shortness of breath, higher fevers, chest pains, more vomiting and or  passing out concerns, she needs to be seen more urgently in an ER setting, and she was understanding of that  I discussed the assessment and treatment plan with the patient. The patient was provided an opportunity to ask questions and all were answered. The patient agreed with the plan and demonstrated an understanding of the instructions.  Red flags and when to present for emergency care or RTC including fevers, chest pain, shortness of breath, new/worsening/un-resolving symptoms reviewed with patient at time of visit.   The patient was advised to call back or seek an in-person evaluation if the symptoms worsen or if the condition fails to improve as anticipated.  I provided 20 minutes of non-face-to-face time during this encounter that included discussing at length patient's sx/history, pertinent pmhx, medications, treatment and follow up plan. This time also included the necessary documentation, orders, and chart review.  Jamelle Haring, MD

## 2020-10-24 LAB — NOVEL CORONAVIRUS, NAA: SARS-CoV-2, NAA: NOT DETECTED

## 2020-10-27 ENCOUNTER — Ambulatory Visit: Payer: Self-pay

## 2020-10-27 NOTE — Telephone Encounter (Signed)
Patient called.  Patient aware.  

## 2020-10-27 NOTE — Telephone Encounter (Signed)
°  Pt. Report last night she started breaking out in hives "all over. None on neck of face. Hives are different sizes and very itchy." No availability.Please advise if she can be worked in. Pt. May decide to go to [facility]. Reason for Disposition  SEVERE itching (i.e., interferes with sleep, normal activities or school)  Answer Assessment - Initial Assessment Questions 1. APPEARANCE of RASH: "Describe the rash." (e.g., spots, blisters, raised areas, skin peeling, scaly)     Hives 2. SIZE: "How big are the spots?" (e.g., tip of pen, eraser, coin; inches, centimeters)     Different sizes 3. LOCATION: "Where is the rash located?"     Arms, legs, trunk 4. COLOR: "What color is the rash?" (Note: It is difficult to assess rash color in people with darker-colored skin. When this situation occurs, simply ask the caller to describe what they see.)     Red 5. ONSET: "When did the rash begin?"     Last night 6. FEVER: "Do you have a fever?" If Yes, ask: "What is your temperature, how was it measured, and when did it start?"     No 7. ITCHING: "Does the rash itch?" If Yes, ask: "How bad is the itch?" (Scale 1-10; or mild, moderate, severe)     10 8. CAUSE: "What do you think is causing the rash?"     Unsure 9. MEDICATION FACTORS: "Have you started any new medications within the last 2 weeks?" (e.g., antibiotics)      No 10. OTHER SYMPTOMS: "Do you have any other symptoms?" (e.g., dizziness, headache, sore throat, joint pain)       No 11. PREGNANCY: "Is there any chance you are pregnant?" "When was your last menstrual period?"       No  Protocols used: RASH OR REDNESS - [facility]

## 2020-10-27 NOTE — Telephone Encounter (Signed)
Called patient. She has been taking Benadryl since last night and it hasn't helped at all. She want to be worked in. Please advise.

## 2020-10-27 NOTE — Telephone Encounter (Signed)
Called patient. She has been taking Benadryl since last night and it hasn't helped at all. She want to be worked in. Please advise. °

## 2020-10-30 ENCOUNTER — Ambulatory Visit: Payer: Self-pay

## 2021-08-20 ENCOUNTER — Encounter: Payer: BC Managed Care – PPO | Admitting: Certified Nurse Midwife

## 2021-09-16 ENCOUNTER — Other Ambulatory Visit: Payer: Self-pay

## 2021-09-16 ENCOUNTER — Emergency Department: Payer: BLUE CROSS/BLUE SHIELD

## 2021-09-16 ENCOUNTER — Encounter: Payer: Self-pay | Admitting: Emergency Medicine

## 2021-09-16 ENCOUNTER — Emergency Department
Admission: EM | Admit: 2021-09-16 | Discharge: 2021-09-16 | Disposition: A | Discharge status: Home / Self Care | Payer: BLUE CROSS/BLUE SHIELD | Attending: Emergency Medicine | Admitting: Emergency Medicine

## 2021-09-16 DIAGNOSIS — O26891 Other specified pregnancy related conditions, first trimester: Secondary | ICD-10-CM | POA: Diagnosis not present

## 2021-09-16 DIAGNOSIS — O210 Mild hyperemesis gravidarum: Secondary | ICD-10-CM | POA: Diagnosis not present

## 2021-09-16 DIAGNOSIS — Z3A01 Less than 8 weeks gestation of pregnancy: Secondary | ICD-10-CM | POA: Diagnosis not present

## 2021-09-16 DIAGNOSIS — F1290 Cannabis use, unspecified, uncomplicated: Secondary | ICD-10-CM | POA: Diagnosis not present

## 2021-09-16 DIAGNOSIS — R1032 Left lower quadrant pain: Secondary | ICD-10-CM | POA: Diagnosis not present

## 2021-09-16 DIAGNOSIS — O99321 Drug use complicating pregnancy, first trimester: Secondary | ICD-10-CM | POA: Insufficient documentation

## 2021-09-16 DIAGNOSIS — O219 Vomiting of pregnancy, unspecified: Secondary | ICD-10-CM | POA: Diagnosis present

## 2021-09-16 DIAGNOSIS — R1030 Lower abdominal pain, unspecified: Secondary | ICD-10-CM

## 2021-09-16 DIAGNOSIS — R1031 Right lower quadrant pain: Secondary | ICD-10-CM | POA: Insufficient documentation

## 2021-09-16 LAB — URINALYSIS, ROUTINE W REFLEX MICROSCOPIC
Bilirubin Urine: NEGATIVE
Glucose, UA: NEGATIVE mg/dL
Ketones, ur: 80 mg/dL — AB
Leukocytes,Ua: NEGATIVE
Nitrite: NEGATIVE
Protein, ur: 100 mg/dL — AB
Specific Gravity, Urine: 1.027 (ref 1.005–1.030)
pH: 5 (ref 5.0–8.0)

## 2021-09-16 LAB — CBC
HCT: 44.5 % (ref 36.0–46.0)
Hemoglobin: 14.5 g/dL (ref 12.0–15.0)
MCH: 27.6 pg (ref 26.0–34.0)
MCHC: 32.6 g/dL (ref 30.0–36.0)
MCV: 84.8 fL (ref 80.0–100.0)
Platelets: 458 10*3/uL — ABNORMAL HIGH (ref 150–400)
RBC: 5.25 MIL/uL — ABNORMAL HIGH (ref 3.87–5.11)
RDW: 12.9 % (ref 11.5–15.5)
WBC: 10.4 10*3/uL (ref 4.0–10.5)
nRBC: 0 % (ref 0.0–0.2)

## 2021-09-16 LAB — COMPREHENSIVE METABOLIC PANEL
ALT: 20 U/L (ref 0–44)
AST: 20 U/L (ref 15–41)
Albumin: 5.1 g/dL — ABNORMAL HIGH (ref 3.5–5.0)
Alkaline Phosphatase: 98 U/L (ref 38–126)
Anion gap: 16 — ABNORMAL HIGH (ref 5–15)
BUN: 10 mg/dL (ref 6–20)
CO2: 13 mmol/L — ABNORMAL LOW (ref 22–32)
Calcium: 9.6 mg/dL (ref 8.9–10.3)
Chloride: 105 mmol/L (ref 98–111)
Creatinine, Ser: 0.59 mg/dL (ref 0.44–1.00)
GFR, Estimated: >60 mL/min (ref >60)
Glucose, Bld: 93 mg/dL (ref 70–99)
Potassium: 4.2 mmol/L (ref 3.5–5.1)
Sodium: 134 mmol/L — ABNORMAL LOW (ref 135–145)
Total Bilirubin: 1.4 mg/dL — ABNORMAL HIGH (ref 0.3–1.2)
Total Protein: 8.8 g/dL — ABNORMAL HIGH (ref 6.5–8.1)

## 2021-09-16 LAB — BASIC METABOLIC PANEL
Anion gap: 11 (ref 5–15)
BUN: 9 mg/dL (ref 6–20)
CO2: 16 mmol/L — ABNORMAL LOW (ref 22–32)
Calcium: 8.9 mg/dL (ref 8.9–10.3)
Chloride: 106 mmol/L (ref 98–111)
Creatinine, Ser: 0.62 mg/dL (ref 0.44–1.00)
GFR, Estimated: >60 mL/min (ref >60)
Glucose, Bld: 92 mg/dL (ref 70–99)
Potassium: 3.8 mmol/L (ref 3.5–5.1)
Sodium: 133 mmol/L — ABNORMAL LOW (ref 135–145)

## 2021-09-16 LAB — HCG, QUANTITATIVE, PREGNANCY: hCG, Beta Chain, Quant, S: 13339 m[IU]/mL — ABNORMAL HIGH (ref <5)

## 2021-09-16 LAB — LIPASE, BLOOD: Lipase: 27 U/L (ref 11–51)

## 2021-09-16 LAB — PREGNANCY, URINE: Preg Test, Ur: POSITIVE — AB

## 2021-09-16 MED ORDER — METOCLOPRAMIDE HCL 5 MG/ML IJ SOLN
10.0000 mg | Freq: Once | INTRAMUSCULAR | Status: AC
  Administered 2021-09-16: 10 mg via INTRAVENOUS
  Filled 2021-09-16: qty 2

## 2021-09-16 MED ORDER — MORPHINE SULFATE (PF) 4 MG/ML IV SOLN
4.0000 mg | Freq: Once | INTRAVENOUS | Status: AC
  Administered 2021-09-16: 4 mg via INTRAVENOUS
  Filled 2021-09-16: qty 1

## 2021-09-16 MED ORDER — LACTATED RINGERS IV BOLUS
1000.0000 mL | Freq: Once | INTRAVENOUS | Status: AC
  Administered 2021-09-16: 1000 mL via INTRAVENOUS

## 2021-09-16 MED ORDER — PROMETHAZINE HCL 12.5 MG PO TABS: 12.5000 mg | ORAL_TABLET | Freq: Four times a day (QID) | ORAL | 0 refills | Status: AC | PRN

## 2021-09-16 MED ORDER — SODIUM CHLORIDE 0.9 % IV SOLN
12.5000 mg | Freq: Once | INTRAVENOUS | Status: AC
  Administered 2021-09-16: 12.5 mg via INTRAVENOUS
  Filled 2021-09-16: qty 12.5

## 2021-09-16 NOTE — ED Triage Notes (Signed)
Pt reports lower abd pain since Friday that is stabbing in nature and goes all the way across her abd and is constant. Pt also reports some NVD with the pain. Pt denies vaginal discharge or urinary sx's.

## 2021-09-16 NOTE — ED Provider Notes (Signed)
[facility] Emergency Department Provider Note   ____________________________________________   Event Date/Time   First MD Initiated Contact with Patient 09/16/21 1205     (approximate)  I have reviewed the triage vital signs and the nursing notes.   HISTORY  Chief Complaint Abdominal Pain, Nausea, Emesis, and Diarrhea    HPI Olivia Sullivan is a 25 y.o. female with past medical history of anxiety, depression, IBS, and hemorrhoids who presents to the ED complaining of abdominal pain.  Patient reports that she has had 3 days of gradually worsening pain in both sides of her lower abdomen that is constant and sharp, not exacerbated or alleviated by anything in particular.  It has been associated with nausea and multiple episodes of vomiting as well as diarrhea.  She denies any blood in her emesis or stool.  She has not had any dysuria, hematuria, vaginal bleeding, or discharge.  She states that her LMP was about 1.5 months ago but her periods have been irregular since she started on Nexplanon a couple of months ago.  She describes current symptoms as similar to prior episodes of cannabinoid hyperemesis syndrome, reports being admitted to [facility] for this in the past but continues to smoke marijuana on a regular basis.        Past Medical History:  Diagnosis Date   Anxiety    Depression    Migraine     Patient Active Problem List   Diagnosis Date Noted   Abnormal liver function tests 08/03/2020   History of iron deficiency 07/06/2020   Irritable bowel syndrome 07/06/2020   Intermittent lightheadedness 07/05/2020   Interstitial cystitis 12/20/2019   Generalized abdominal pain    Rectum inflammation    Internal hemorrhoids    Diverticulosis of small intestine without hemorrhage    Nausea and vomiting    Lower abdominal pain 01/29/2019   Diarrhea 01/29/2019   GAD (generalized anxiety disorder) 01/29/2019   Severe episode of recurrent major depressive  disorder, without psychotic features ([place]) 01/29/2019   Panic disorder with agoraphobia 06/19/2018    Past Surgical History:  Procedure Laterality Date   COLONOSCOPY WITH PROPOFOL N/A 03/11/2019   Procedure: COLONOSCOPY WITH PROPOFOL;  Surgeon: Virgel Manifold, MD;  Location: [facility] ENDOSCOPY;  Service: Endoscopy;  Laterality: N/A;   CYSTO WITH HYDRODISTENSION N/A 10/25/2019   Procedure: CYSTOSCOPY/HYDRODISTENSION;  Surgeon: Bjorn Loser, MD;  Location: [facility] ORS;  Service: Urology;  Laterality: N/A;   ESOPHAGOGASTRODUODENOSCOPY (EGD) WITH PROPOFOL N/A 03/11/2019   Procedure: ESOPHAGOGASTRODUODENOSCOPY (EGD) WITH PROPOFOL;  Surgeon: Virgel Manifold, MD;  Location: [facility] ENDOSCOPY;  Service: Endoscopy;  Laterality: N/A;   NO PAST SURGERIES      Prior to Admission medications   Medication Sig Start Date End Date Taking? Authorizing Provider  promethazine (PHENERGAN) 12.5 MG tablet Take 1 tablet (12.5 mg total) by mouth every 6 (six) hours as needed for nausea or vomiting. 09/16/21  Yes Blake Divine, MD  augmented betamethasone dipropionate (DIPROLENE-AF) 0.05 % cream Apply topically 2 (two) times daily. Patient not taking: Reported on 10/23/2020 08/01/20   [provider]  Drospirenone (SLYND) 4 MG TABS Take 1 tablet by mouth daily. 08/17/20   Diona Fanti, CNM  tretinoin (RETIN-A) 0.025 % cream SMARTSIG:Sparingly Topical Every Other Day Patient not taking: Reported on 10/23/2020 08/01/20   [provider]    Allergies Capsaicin-cleansing gel and Latex  Family History  Problem Relation Age of Onset   Diabetes Father    Depression Father  Anxiety disorder Father    Depression Sister    Anxiety disorder Sister    Irritable bowel syndrome Sister    Depression Brother    Anxiety disorder Brother    Irritable bowel syndrome Brother    Diabetes Maternal Grandmother    Endometriosis Maternal Grandmother    Endometriosis Maternal Aunt    Breast  cancer Neg Hx    Ovarian cancer Neg Hx    Colon cancer Neg Hx     Social History Social History   Tobacco Use   Smoking status: Never   Smokeless tobacco: Never  Vaping Use   Vaping Use: Never used  Substance Use Topics   Alcohol use: Yes    Comment: occasionally   Drug use: Yes    Types: Marijuana    Comment: occas    Review of Systems  Constitutional: No fever/chills Eyes: No visual changes. ENT: No sore throat. Cardiovascular: Denies chest pain. Respiratory: Denies shortness of breath. Gastrointestinal: Positive for abdominal pain, nausea, and vomiting.  No diarrhea.  No constipation. Genitourinary: Negative for dysuria. Musculoskeletal: Negative for back pain. Skin: Negative for rash. Neurological: Negative for headaches, focal weakness or numbness.  ____________________________________________   PHYSICAL EXAM:  VITAL SIGNS: ED Triage Vitals  Enc Vitals Group     BP 09/16/21 0859 (!) 114/94     Pulse Rate 09/16/21 0859 75     Resp 09/16/21 0859 20     Temp 09/16/21 0859 97.6 F (36.4 C)     Temp Source 09/16/21 0859 Oral     SpO2 09/16/21 0859 100 %     Weight 09/16/21 0857 155 lb (70.3 kg)     Height 09/16/21 0857 5\' 5"  (1.651 m)     Head Circumference --      Peak Flow --      Pain Score 09/16/21 0857 10     Pain Loc --      Pain Edu? --      Excl. in GC? --     Constitutional: Alert and oriented. Eyes: Conjunctivae are normal. Head: Atraumatic. Nose: No congestion/rhinnorhea. Mouth/Throat: Mucous membranes are moist. Neck: Normal ROM Cardiovascular: Normal rate, regular rhythm. Grossly normal heart sounds.  2+ radial pulses bilaterally. Respiratory: Normal respiratory effort.  No retractions. Lungs CTAB. Gastrointestinal: Soft and tender to palpation in the bilateral lower quadrants with no rebound or guarding.  No CVA tenderness bilaterally.  No distention. Genitourinary: deferred Musculoskeletal: No lower extremity tenderness nor  edema. Neurologic:  Normal speech and language. No gross focal neurologic deficits are appreciated. Skin:  Skin is warm, dry and intact. No rash noted. Psychiatric: Mood and affect are normal. Speech and behavior are normal.  ____________________________________________   LABS (all labs ordered are listed, but only abnormal results are displayed)  Labs Reviewed  COMPREHENSIVE METABOLIC PANEL - Abnormal; Notable for the following components:      Result Value   Sodium 134 (*)    CO2 13 (*)    Total Protein 8.8 (*)    Albumin 5.1 (*)    Total Bilirubin 1.4 (*)    Anion gap 16 (*)    All other components within normal limits  CBC - Abnormal; Notable for the following components:   RBC 5.25 (*)    Platelets 458 (*)    All other components within normal limits  URINALYSIS, ROUTINE W REFLEX MICROSCOPIC - Abnormal; Notable for the following components:   Color, Urine YELLOW (*)    APPearance CLEAR (*)  Hgb urine dipstick SMALL (*)    Ketones, ur 80 (*)    Protein, ur 100 (*)    Bacteria, UA RARE (*)    All other components within normal limits  PREGNANCY, URINE - Abnormal; Notable for the following components:   Preg Test, Ur POSITIVE (*)    All other components within normal limits  HCG, QUANTITATIVE, PREGNANCY - Abnormal; Notable for the following components:   hCG, Beta Chain, Quant, S 13,339 (*)    All other components within normal limits  BASIC METABOLIC PANEL - Abnormal; Notable for the following components:   Sodium 133 (*)    CO2 16 (*)    All other components within normal limits  LIPASE, BLOOD    PROCEDURES  Procedure(s) performed (including Critical Care):  Procedures   ____________________________________________   INITIAL IMPRESSION / ASSESSMENT AND PLAN / ED COURSE      25 year old female with past medical history of anxiety, depression, IBS, and hemorrhoids who presents to the ED with 3 days of worsening bilateral lower quadrant abdominal pain  associated with frequent nausea, vomiting, and diarrhea.  Patient describes symptoms as similar to when she has dealt with cannabinoid hyperemesis in the past, admits that she continues to smoke marijuana.  However, pregnancy test is positive today and she would be approximately [redacted] weeks along based on her LMP.  We will add on beta hCG level and further assess with ultrasound.  She does appear significantly dehydrated with increased anion gap and acidosis suggesting some starvation ketosis, especially given ketones in her urine.  UA shows no signs of infection.  We will treat symptomatically with IV morphine and Phenergan, hydrate with IV fluids and reassess.  Ultrasound shows intrauterine pregnancy with gestational sac and yolk sac visualized in the uterus.  Patient's pain is much improved on reassessment, however she states she has been dealing with intermittent cramping and ongoing nausea.  She was given dose of IV Reglan with improvement in nausea and cramping, is now tolerating p.o. without difficulty.  Repeat BMP shows normalization of anion gap and improved acidosis.  There is likely a component of both cannabinoid hyperemesis and hyperemesis gravidarum.  Patient is appropriate for discharge home with outpatient follow-up with OB/GYN, was counseled to avoid marijuana and to return to the ED for new worsening symptoms.  She will be prescribed short course of Phenergan, patient agrees with plan.      ____________________________________________   FINAL CLINICAL IMPRESSION(S) / ED DIAGNOSES  Final diagnoses:  Hyperemesis gravidarum  Abdominal pain during pregnancy in first trimester  Marijuana use     ED Discharge Orders          Ordered    promethazine (PHENERGAN) 12.5 MG tablet  Every 6 hours PRN        09/16/21 1833             Note:  This document was prepared using Dragon voice recognition software and may include unintentional dictation errors.    Blake Divine,  MD 09/16/21 [phone]

## 2021-09-18 ENCOUNTER — Emergency Department
Admission: EM | Admit: 2021-09-18 | Discharge: 2021-09-18 | Disposition: A | Discharge status: Home / Self Care | Payer: BLUE CROSS/BLUE SHIELD | Attending: Emergency Medicine | Admitting: Emergency Medicine

## 2021-09-18 ENCOUNTER — Encounter: Payer: Self-pay | Admitting: *Deleted

## 2021-09-18 ENCOUNTER — Other Ambulatory Visit: Payer: Self-pay

## 2021-09-18 ENCOUNTER — Emergency Department: Payer: BLUE CROSS/BLUE SHIELD

## 2021-09-18 DIAGNOSIS — O26891 Other specified pregnancy related conditions, first trimester: Secondary | ICD-10-CM | POA: Diagnosis present

## 2021-09-18 DIAGNOSIS — Z91040 Latex allergy status: Secondary | ICD-10-CM | POA: Diagnosis not present

## 2021-09-18 DIAGNOSIS — Z3A01 Less than 8 weeks gestation of pregnancy: Secondary | ICD-10-CM | POA: Diagnosis not present

## 2021-09-18 DIAGNOSIS — R1013 Epigastric pain: Secondary | ICD-10-CM | POA: Diagnosis not present

## 2021-09-18 DIAGNOSIS — E860 Dehydration: Secondary | ICD-10-CM | POA: Diagnosis not present

## 2021-09-18 DIAGNOSIS — O211 Hyperemesis gravidarum with metabolic disturbance: Secondary | ICD-10-CM | POA: Diagnosis not present

## 2021-09-18 LAB — URINALYSIS, COMPLETE (UACMP) WITH MICROSCOPIC
Bilirubin Urine: NEGATIVE
Glucose, UA: >=500 mg/dL — AB
Ketones, ur: 80 mg/dL — AB
Leukocytes,Ua: NEGATIVE
Nitrite: NEGATIVE
Protein, ur: 100 mg/dL — AB
Specific Gravity, Urine: 1.026 (ref 1.005–1.030)
pH: 6 (ref 5.0–8.0)

## 2021-09-18 LAB — CBC
HCT: 42.9 % (ref 36.0–46.0)
Hemoglobin: 14.4 g/dL (ref 12.0–15.0)
MCH: 28.4 pg (ref 26.0–34.0)
MCHC: 33.6 g/dL (ref 30.0–36.0)
MCV: 84.6 fL (ref 80.0–100.0)
Platelets: 393 10*3/uL (ref 150–400)
RBC: 5.07 MIL/uL (ref 3.87–5.11)
RDW: 13.1 % (ref 11.5–15.5)
WBC: 12.6 10*3/uL — ABNORMAL HIGH (ref 4.0–10.5)
nRBC: 0 % (ref 0.0–0.2)

## 2021-09-18 LAB — COMPREHENSIVE METABOLIC PANEL
ALT: 18 U/L (ref 0–44)
AST: 18 U/L (ref 15–41)
Albumin: 4.8 g/dL (ref 3.5–5.0)
Alkaline Phosphatase: 92 U/L (ref 38–126)
Anion gap: 13 (ref 5–15)
BUN: 14 mg/dL (ref 6–20)
CO2: 16 mmol/L — ABNORMAL LOW (ref 22–32)
Calcium: 9.8 mg/dL (ref 8.9–10.3)
Chloride: 106 mmol/L (ref 98–111)
Creatinine, Ser: 0.73 mg/dL (ref 0.44–1.00)
GFR, Estimated: >60 mL/min (ref >60)
Glucose, Bld: 171 mg/dL — ABNORMAL HIGH (ref 70–99)
Potassium: 4.1 mmol/L (ref 3.5–5.1)
Sodium: 135 mmol/L (ref 135–145)
Total Bilirubin: 1.4 mg/dL — ABNORMAL HIGH (ref 0.3–1.2)
Total Protein: 8.3 g/dL — ABNORMAL HIGH (ref 6.5–8.1)

## 2021-09-18 LAB — LIPASE, BLOOD: Lipase: 34 U/L (ref 11–51)

## 2021-09-18 LAB — HCG, QUANTITATIVE, PREGNANCY: hCG, Beta Chain, Quant, S: 17730 m[IU]/mL — ABNORMAL HIGH (ref <5)

## 2021-09-18 MED ORDER — DEXTROSE IN LACTATED RINGERS 5 % IV SOLN: Freq: Once | INTRAVENOUS | Status: AC

## 2021-09-18 MED ORDER — DEXTROSE 5 % IN LACTATED RINGERS IV BOLUS: 2000.0000 mL | Freq: Once | INTRAVENOUS | Status: DC

## 2021-09-18 MED ORDER — SODIUM CHLORIDE 0.9 % IV BOLUS
1000.0000 mL | Freq: Once | INTRAVENOUS | Status: AC
  Administered 2021-09-18: 1000 mL via INTRAVENOUS

## 2021-09-18 MED ORDER — ONDANSETRON HCL 4 MG/2ML IJ SOLN
4.0000 mg | Freq: Once | INTRAMUSCULAR | Status: AC
  Administered 2021-09-18: 4 mg via INTRAVENOUS
  Filled 2021-09-18: qty 2

## 2021-09-18 MED ORDER — LACTATED RINGERS IV BOLUS: 1000.0000 mL | Freq: Once | INTRAVENOUS | Status: DC

## 2021-09-18 MED ORDER — OXYCODONE HCL 5 MG PO TABS
5.0000 mg | ORAL_TABLET | Freq: Once | ORAL | Status: AC
  Administered 2021-09-18: 5 mg via ORAL
  Filled 2021-09-18: qty 1

## 2021-09-18 MED ORDER — ONDANSETRON HCL 4 MG/2ML IJ SOLN: 4.0000 mg | Freq: Once | INTRAMUSCULAR | Status: DC

## 2021-09-18 MED ORDER — DEXTROSE 5 % IN LACTATED RINGERS IV BOLUS
1000.0000 mL | Freq: Once | INTRAVENOUS | Status: AC
  Administered 2021-09-18: 1000 mL via INTRAVENOUS
  Filled 2021-09-18: qty 1000

## 2021-09-18 MED ORDER — ONDANSETRON 4 MG PO TBDP
4.0000 mg | ORAL_TABLET | Freq: Once | ORAL | Status: AC
  Administered 2021-09-18: 4 mg via ORAL
  Filled 2021-09-18: qty 1

## 2021-09-18 MED ORDER — METOCLOPRAMIDE HCL 5 MG/ML IJ SOLN
10.0000 mg | Freq: Once | INTRAMUSCULAR | Status: AC
  Administered 2021-09-18: 10 mg via INTRAVENOUS
  Filled 2021-09-18: qty 2

## 2021-09-18 NOTE — ED Notes (Signed)
Complain of continuous nausea EDP aware Zofran 4mg  IV given.

## 2021-09-18 NOTE — ED Provider Notes (Signed)
[facility] Emergency Department Provider Note ____________________________________________   Event Date/Time   First MD Initiated Contact with Patient 09/18/21 0800     (approximate)  I have reviewed the triage vital signs and the nursing notes.  HISTORY  Chief Complaint Emesis   HPI Olivia Sullivan is a 25 y.o. femalewho presents to the ED for evaluation of emesis.   Chart review indicates patient was just seen 2 days ago in our ED for the ration of abdominal pain and emesis, diagnosed with first trimester pregnancy and hyperemesis gravidarum.  She did not know that she was pregnant.  She had a Nexplanon placed about a month ago.  She had some spotting alongside this, but no gross abdominal bleeding or passage of clots.  She had a TVUS demonstrating yolk sac and gestational sac without clear fetus yet, beta hCG around 13,000.  She presents today to the ED, accompanied by her boyfriend, for evaluation of inability to keep anything down.  She reports continued emesis and feeling awful with generalized malaise and weakness, feeling dehydration.  Denies any changes to her abdominal pain, fever, syncope or diarrhea.  She presents with her boyfriend, who provides some additional history.  They report they are not planning to keep this gestation.    Past Medical History:  Diagnosis Date   Anxiety    Depression    Migraine     Patient Active Problem List   Diagnosis Date Noted   Abnormal liver function tests 08/03/2020   History of iron deficiency 07/06/2020   Irritable bowel syndrome 07/06/2020   Intermittent lightheadedness 07/05/2020   Interstitial cystitis 12/20/2019   Generalized abdominal pain    Rectum inflammation    Internal hemorrhoids    Diverticulosis of small intestine without hemorrhage    Nausea and vomiting    Lower abdominal pain 01/29/2019   Diarrhea 01/29/2019   GAD (generalized anxiety disorder) 01/29/2019   Severe episode of  recurrent major depressive disorder, without psychotic features ([facility]) 01/29/2019   Panic disorder with agoraphobia 06/19/2018    Past Surgical History:  Procedure Laterality Date   COLONOSCOPY WITH PROPOFOL N/A 03/11/2019   Procedure: COLONOSCOPY WITH PROPOFOL;  Surgeon: [name], MD;  Location: [facility] END[facility]COPY;  Service: Endoscopy;  Laterality: N/A;   CYSTO WITH HYDRODISTENSION N/A 10/25/2019   Procedure: CYSTOSCOPY/HYDRODISTENSION;  Surgeon: Alfredo Martinez, MD;  Location: [facility] ORS;  Service: Urology;  Laterality: N/A;   ESOPHAGOGASTRODUODENOSCOPY (EGD) WITH PROPOFOL N/A 03/11/2019   Procedure: ESOPHAGOGASTRODUODENOSCOPY (EGD) WITH PROPOFOL;  Surgeon: [name], MD;  Location: [facility] END[facility];  Service: Endoscopy;  Laterality: N/A;   NO PAST SURGERIES      Prior to Admission medications   Medication Sig Start Date End Date Taking? Authorizing Provider  augmented betamethasone dipropionate (DIPROLENE-AF) 0.05 % cream Apply topically 2 (two) times daily. Patient not taking: Reported on 10/23/2020 08/01/20   [provider]  Drospirenone (SLYND) 4 MG TABS Take 1 tablet by mouth daily. 08/17/20   Lawhorn, Vanessa Grafton, CNM  promethazine (PHENERGAN) 12.5 MG tablet Take 1 tablet (12.5 mg total) by mouth every 6 (six) hours as needed for nausea or vomiting. 09/16/21   Chesley Noon, MD  tretinoin (RETIN-A) 0.025 % cream SMARTSIG:Sparingly Topical Every Other Day Patient not taking: Reported on 10/23/2020 08/01/20   [provider]    Allergies Capsaicin-cleansing gel and Latex  Family History  Problem Relation Age of Onset   Diabetes Father    Depression Father    Anxiety  disorder Father    Depression Sister    Anxiety disorder Sister    Irritable bowel syndrome Sister    Depression Brother    Anxiety disorder Brother    Irritable bowel syndrome Brother    Diabetes Maternal Grandmother    Endometriosis Maternal Grandmother    Endometriosis  Maternal Aunt    Breast cancer Neg Hx    Ovarian cancer Neg Hx    Colon cancer Neg Hx     Social History Social History   Tobacco Use   Smoking status: Never   Smokeless tobacco: Never  Vaping Use   Vaping Use: Never used  Substance Use Topics   Alcohol use: Yes    Comment: occasionally   Drug use: Yes    Types: Marijuana    Comment: occas    Review of Systems  Constitutional: No fever/chills Eyes: No visual changes. ENT: No sore throat. Cardiovascular: Denies chest pain. Respiratory: Denies shortness of breath. Gastrointestinal: Positive for abdominal pain, nausea and vomiting.  No diarrhea.  No constipation. Genitourinary: Negative for dysuria. Musculoskeletal: Negative for back pain. Skin: Negative for rash. Neurological: Negative for headaches, focal weakness or numbness.  ____________________________________________   PHYSICAL EXAM:  VITAL SIGNS: Vitals:   09/18/21 1030 09/18/21 1136  BP: 116/66 119/64  Pulse: 83   Resp: 16 20  Temp:  98.3 F (36.8 C)  SpO2: 100% 99%    Constitutional: Alert and oriented. Well appearing and in no acute distress. Eyes: Conjunctivae are normal. PERRL. EOMI. Head: Atraumatic. Nose: No congestion/rhinnorhea. Mouth/Throat: Mucous membranes are dry.  Oropharynx non-erythematous. Neck: No stridor. No cervical spine tenderness to palpation. Cardiovascular: Normal rate, regular rhythm. Grossly normal heart sounds.  Good peripheral circulation. Respiratory: Normal respiratory effort.  No retractions. Lungs CTAB. Gastrointestinal: Soft , nondistended. No CVA tenderness. Minimal lower abdominal tenderness without peritoneal features. Musculoskeletal: No lower extremity tenderness nor edema.  No joint effusions. No signs of acute trauma. Neurologic:  Normal speech and language. No gross focal neurologic deficits are appreciated. No gait instability noted. Skin:  Skin is warm, dry and intact. No rash noted. Psychiatric: Mood and  affect are normal. Speech and behavior are normal. ____________________________________________   LABS (all labs ordered are listed, but only abnormal results are displayed)  Labs Reviewed  COMPREHENSIVE METABOLIC PANEL - Abnormal; Notable for the following components:      Result Value   CO2 16 (*)    Glucose, Bld 171 (*)    Total Protein 8.3 (*)    Total Bilirubin 1.4 (*)    All other components within normal limits  CBC - Abnormal; Notable for the following components:   WBC 12.6 (*)    All other components within normal limits  HCG, QUANTITATIVE, PREGNANCY - Abnormal; Notable for the following components:   hCG, Beta Chain, Quant, S 17,730 (*)    All other components within normal limits  URINALYSIS, COMPLETE (UACMP) WITH MICROSCOPIC - Abnormal; Notable for the following components:   Color, Urine YELLOW (*)    APPearance CLOUDY (*)    Glucose, UA >=500 (*)    Hgb urine dipstick SMALL (*)    Ketones, ur 80 (*)    Protein, ur 100 (*)    Bacteria, UA RARE (*)    All other components within normal limits  LIPASE, BLOOD   ____________________________________________  12 Lead EKG   ____________________________________________  RADIOLOGY  ED MD interpretation: RUQ ultrasound reviewed by me with no biliary pathology such as stones or obstruction. TVUS  from 2 days ago reviewed by me with gestational sac  Official radiology report(s): US ABDOMEN LIMITED RUQ (LIVER/GB)  Result Date: 09/18/2021 CLINICAL DATA:  25 year old female with history of epigastric pain for the past 3-4 days. Vomiting. EXAM: ULTRASOUND ABDOMEN LIMITED RIGHT UPPER QUADRANT COMPARISON:  None. FINDINGS: Gallbladder: No gallstones or wall thickening visualized. No sonographic Murphy sign noted by sonographer. Common bile duct: Diameter: 2.6 mm Liver: There are some subtle mass-like areas in the liver which are of heterogeneous echogenicity, measuring 6.0 x 5.3 x 6.2 cm in the left lobe and 5.7 x 5.7 x 5.5  cm in the right lobe near the dome. Both of these regions appear to have internal vascularity on color Doppler imaging. Within normal limits in parenchymal echogenicity. Portal vein is patent on color Doppler imaging with normal direction of blood flow towards the liver. Other: None. IMPRESSION: 1. No gallstones or findings to suggest acute cholecystitis. 2. Two mass-like areas in the liver are indeterminate by ultrasound examination. Further evaluation with nonemergent abdominal MRI with and without IV gadolinium is recommended in the near future to better characterize these findings, as the possibility of hepatic adenomas is considered likely in this young female patient. Electronically Signed   By: Trudie Reed M.D.   On: 09/18/2021 05:55    ____________________________________________  PROCEDURES and INTERVENTIONS  Procedure(s) performed (including Critical Care):  Procedures  Medications  oxyCODONE (Oxy IR/ROXICODONE) immediate release tablet 5 mg (5 mg Oral Given 09/18/21 0403)  sodium chloride 0.9 % bolus 1,000 mL (0 mLs Intravenous Stopped 09/18/21 0807)  ondansetron (ZOFRAN-ODT) disintegrating tablet 4 mg (4 mg Oral Given 09/18/21 0401)  dextrose 5 % in lactated ringers infusion (0 mLs Intravenous Stopped 09/18/21 0928)    And  dextrose 5 % in lactated ringers infusion (0 mLs Intravenous Stopped 09/18/21 1222)  ondansetron (ZOFRAN) injection 4 mg (4 mg Intravenous Given 09/18/21 0903)  metoCLOPramide (REGLAN) injection 10 mg (10 mg Intravenous Given 09/18/21 0932)  dextrose 5% lactated ringers bolus 1,000 mL (0 mLs Intravenous Stopped 09/18/21 1222)    ____________________________________________   MDM / ED COURSE   25 year old female presents to the ED with evidence of dehydration associated with hyperemesis gravidarum first trimester pregnancy.  She had a pelvic ultrasound 2 days ago and has appropriately rising beta hCG, without evidence of ectopic pregnancy or significant  obstetric complications beyond dehydration associated hyperemesis gravidarum.  Her decreased bicarbonate and metabolic acidosis further suggests this.  RUQ ultrasound today without evidence of biliary pathology to contribute.  We will rehydrate and provide antiemetics with expectation of outpatient management thereafter.  Clinical Course as of 09/18/21 1510  Tue Sep 18, 2021  3662 Patient ambulates to the restroom independently. [DS]  1143 Reassessed.  Patient reports feeling much better and she certainly looks improved than on arrival.  Boyfriend switched out for her mother, mother is brushing her hair.  We discussed dehydration, maintaining hydration at home and antiemetics to use.  We discussed return precautions for the ED. [DS]    Clinical Course User Index [DS] Delton Prairie, MD    ____________________________________________   FINAL CLINICAL IMPRESSION(S) / ED DIAGNOSES  Final diagnoses:  Epigastric abdominal pain  Dehydration  Less than [redacted] weeks gestation of pregnancy  Hyperemesis gravidarum with dehydration     ED Discharge Orders     None        Niaomi Cartaya Katrinka Blazing   Note:  This document was prepared using Dragon voice recognition software and may include unintentional dictation errors.  Delton Prairie, MD 09/18/21 [phone]

## 2021-09-18 NOTE — ED Provider Notes (Signed)
HPI: Pt is a 25 y.o. female who presents with complaints of abdominal pain.   The patient p/w  abdominal pain. Vomiting started Friday afternoon. Originally yellow vomiting and now some dark blood tinged noted. Vaginal bleeding. Upper abdominal pain. Does not plan to keep the pregnancy.   ROS: Denies fever  Past Medical History:  Diagnosis Date   Anxiety    Depression    Migraine    Vitals:   09/18/21 0025 09/18/21 0130  BP: 100/71 136/84  Pulse:  88  Resp:  (!) 22  Temp:  98.6 F (37 C)  SpO2:  98%    Focused Physical Exam: Gen: No acute distress Head: atraumatic, normocephalic Eyes: Extraocular movements grossly intact; conjunctiva clear CV: RRR Lung: No increased WOB, no stridor GI: ND, no obvious masses Neuro: Alert and awake  Medical Decision Making and Plan: Given the patient's initial medical screening exam, the following diagnostic evaluation has been ordered. The patient will be placed in the appropriate treatment space, once one is available, to complete the evaluation and treatment. I have discussed the plan of care with the patient and I have advised the patient that an ED physician or mid-level practitioner will reevaluate their condition after the test results have been received, as the results may give them additional insight into the type of treatment they may need.   Diagnostics:US, labs   Treatments: none immediately   Concha Se, MD 09/18/21 [phone]

## 2021-09-18 NOTE — ED Notes (Signed)
Patient sitting up in bed. Mom in room brushing hair. States feeling much better.

## 2021-09-18 NOTE — ED Triage Notes (Addendum)
Pt reports vomiting and abd pain.  Pt was seen here yesterday with similar sx.  Pt is pregnant.  Pt hyperventilating.  Pt reports vag bleeding. Denies urinary sx.  Pt alert  speech clear.
